# Patient Record
Sex: Female | Born: 1960 | Race: White | Hispanic: No | Marital: Married | Smoking: Current every day smoker
Health system: Southern US, Community
[De-identification: ages and names within clinical notes are randomized; demographics above are authoritative.]

## PROBLEM LIST (undated history)

## (undated) DIAGNOSIS — J439 Emphysema, unspecified: Secondary | ICD-10-CM

## (undated) DIAGNOSIS — J449 Chronic obstructive pulmonary disease, unspecified: Secondary | ICD-10-CM

## (undated) DIAGNOSIS — F1910 Other psychoactive substance abuse, uncomplicated: Secondary | ICD-10-CM

## (undated) HISTORY — PX: NO PAST SURGERIES: SHX2092

---

## 2006-11-24 ENCOUNTER — Emergency Department: Payer: Self-pay | Admitting: Emergency Medicine

## 2011-02-07 ENCOUNTER — Emergency Department: Payer: Self-pay | Admitting: Emergency Medicine

## 2012-09-28 ENCOUNTER — Ambulatory Visit: Payer: Self-pay | Admitting: Internal Medicine

## 2012-09-28 LAB — URINALYSIS, COMPLETE
Bacteria: NEGATIVE
Blood: NEGATIVE
Glucose,UR: NEGATIVE mg/dL (ref 0–75)
Leukocyte Esterase: NEGATIVE
Nitrite: NEGATIVE
Ph: 6 (ref 4.5–8.0)
Protein: 100
Specific Gravity: >=1.03 (ref 1.003–1.030)
WBC UR: NONE SEEN /HPF (ref 0–5)

## 2012-09-30 LAB — URINE CULTURE

## 2014-03-12 ENCOUNTER — Ambulatory Visit: Payer: Self-pay | Admitting: Family Medicine

## 2014-03-12 LAB — URINALYSIS, COMPLETE
BACTERIA: NEGATIVE
Bilirubin,UR: NEGATIVE
Blood: NEGATIVE
Glucose,UR: NEGATIVE
KETONE: NEGATIVE
LEUKOCYTE ESTERASE: NEGATIVE
Nitrite: NEGATIVE
PROTEIN: NEGATIVE
Ph: 6 (ref 5.0–8.0)
SQUAMOUS EPITHELIAL: NONE SEEN
Specific Gravity: 1.015 (ref 1.000–1.030)
WBC UR: NONE SEEN /HPF (ref 0–5)

## 2014-03-14 LAB — URINE CULTURE

## 2016-11-24 ENCOUNTER — Encounter: Payer: Self-pay | Admitting: *Deleted

## 2016-11-24 ENCOUNTER — Ambulatory Visit
Admission: EM | Admit: 2016-11-24 | Discharge: 2016-11-24 | Disposition: A | Discharge status: Home / Self Care | Payer: Medicaid Other | Attending: Family Medicine | Admitting: Family Medicine

## 2016-11-24 DIAGNOSIS — F17200 Nicotine dependence, unspecified, uncomplicated: Secondary | ICD-10-CM | POA: Diagnosis not present

## 2016-11-24 DIAGNOSIS — S1096XA Insect bite of unspecified part of neck, initial encounter: Secondary | ICD-10-CM | POA: Insufficient documentation

## 2016-11-24 DIAGNOSIS — W57XXXA Bitten or stung by nonvenomous insect and other nonvenomous arthropods, initial encounter: Secondary | ICD-10-CM

## 2016-11-24 MED ORDER — MUPIROCIN 2 % EX OINT: TOPICAL_OINTMENT | CUTANEOUS | 0 refills | Status: DC

## 2016-11-24 MED ORDER — DOXYCYCLINE HYCLATE 100 MG PO CAPS: 100.0000 mg | ORAL_CAPSULE | Freq: Two times a day (BID) | ORAL | 0 refills | Status: DC

## 2016-11-24 NOTE — ED Provider Notes (Signed)
[facility] ____________________________________________  Time seen: Approximately 11:55 AM  I have reviewed the triage vital signs and the nursing notes.   HISTORY  Chief Complaint Insect Bite  HPI Erica Schmidt is a 56 y.o. female  presenting for evaluation of tick bite site to right neck. Patient states she removed a tick to the area 2-3 days ago and has since had itching and tenderness. Patient expressed concern of swelling directly around tick bite site. Denies fevers. Reports continues to eat and drink well. Denies any pain or difficulty swallowing. Denies any lip, tongue, oral or throat swelling sensation. Denies any other skin rash, skin changes or insect bites. Patient states that she thinks the tick was attached 1-2 days, but unsure. Patient states that she feels that she probably picked up to take when she went and walked to the chicken coup. Reports the area does itch but has some tenderness. Denies any sore throat, ear pain, facial swelling, vision changes, joint pain, fevers, vomiting, dizziness or cough.  Denies chest pain, shortness of breath, abdominal pain.  Denies recent sickness. Denies recent antibiotic use. Reports tetanus immunization is up-to-date. Patient denies chronic medical problems. States does not take daily medications. denies cardiac history or renal insufficiency.    History reviewed. No pertinent past medical history. Denies  Patient denies past medical history, in reviewing care everywhere in [place], multiple medical history notes present including history of borderline intellectual functioning, benzodiazepine abuse, methadone use and abuse as well as polysubstance abuse, hypertension, aspiration pneumonia.  There are no active problems to display for this patient.   History reviewed. No pertinent surgical history.   No current facility-administered medications for this encounter.   Current Outpatient Prescriptions:  .  doxycycline  (VIBRAMYCIN) 100 MG capsule, Take 1 capsule (100 mg total) by mouth 2 (two) times daily., Disp: 20 capsule, Rfl: 0 .  mupirocin ointment (BACTROBAN) 2 %, Apply three times a day for 7 days., Disp: 22 g, Rfl: 0  Allergies Patient has no known allergies.  History reviewed. No pertinent family history.  Social History Social History  Substance Use Topics  . Smoking status: Current Every Day Smoker  . Smokeless tobacco: Never Used  . Alcohol use No  Patient denies being an abusive relationship. Patient denies SI or HI.  Review of Systems Constitutional: No fever/chills Eyes: No visual changes. ENT: No sore throat. Cardiovascular: Denies chest pain. Respiratory: Denies shortness of breath. Gastrointestinal: No abdominal pain.  No nausea, no vomiting.  No diarrhea.  No constipation. Genitourinary: Negative for dysuria. Musculoskeletal: Negative for back pain. Skin: as above. Neurological: Negative for headaches, focal weakness or numbness.  ____________________________________________   PHYSICAL EXAM:  VITAL SIGNS: ED Triage Vitals  Enc Vitals Group     BP 11/24/16 1113 (!) 143/76     Pulse Rate 11/24/16 1113 76     Resp 11/24/16 1113 16     Temp 11/24/16 1113 98.4 F (36.9 C)     Temp Source 11/24/16 1113 Oral     SpO2 11/24/16 1113 92 %     Weight 11/24/16 1115 100 lb (45.4 kg)     Height 11/24/16 1115 5\' 2"  (1.575 m)     Head Circumference --      Peak Flow --      Pain Score --      Pain Loc --      Pain Edu? --      Excl. in GC? --     Constitutional: Alert  and oriented. Well appearing. Patient appears anxious. Eyes: Conjunctivae are normal.  ENT      Head: Normocephalic and atraumatic.      Ears: no erythema, normal TMs bilaterally.       Nose: No congestion/rhinnorhea.      Mouth/Throat: Mucous membranes are moist.Oropharynx non-erythematous. No tonsillar swelling or exudate. No lip, tongue or oral swelling noted. Neck: No stridor. Supple without  meningismus.  Hematological/Lymphatic/Immunilogical: No cervical lymphadenopathy. Cardiovascular: Normal rate, regular rhythm. Grossly normal heart sounds.  Good peripheral circulation. Respiratory: Normal respiratory effort without tachypnea nor retractions. Breath sounds are clear and equal bilaterally. No wheezes, rales, rhonchi. Gastrointestinal: Soft and nontender.  Musculoskeletal: Ambulatory with steady gait. No midline cervical, thoracic or lumbar tenderness to palpation.  Neurologic:  Normal speech and language. No gross focal neurologic deficits are appreciated. Speech is normal. No gait instability.  Skin:  Skin is warm, dry . Except: Right lower anterior neck 2 areas of approximately 1 cm minimally erythematous and minimally raised lesion with centered punctum, no drainage, no fluctuance or induration, no appearance of the foreign body, no further surrounding erythema, nontender to direct palpation, no appearance of bull's-eye rash, no other swelling noted, no palpated lymphadenopathy.  Psychiatric: Mood and affect are normal. Speech and behavior are normal. Patient exhibits appropriate insight and judgment   ___________________________________________   LABS (all labs ordered are listed, but only abnormal results are displayed)  Labs Reviewed  ROCKY MTN SPOTTED FVR ABS PNL(IGG+IGM)  B. BURGDORFI ANTIBODIES    RADIOLOGY  No results found. ____________________________________________   PROCEDURES Procedures    INITIAL IMPRESSION / ASSESSMENT AND PLAN / ED COURSE  Pertinent labs & imaging results that were available during my care of the patient were reviewed by me and considered in my medical decision making (see chart for details).  Patient appears anxious. Overall well-appearing. Drinking fluids in urgent care. Resent for evaluation of the tick bite site to right anterior neck. 2 areas noted and suspicious for recent insect bite. Discussed with patient areas appear to  be consistent with local reaction from insect bite, patient expresses concern of "tick fever "and patient seems to be unable to give complete clear history of timing, discussed with patient we'll [facility] spotted fever labs as well as Lyme disease labs. Will empirically start patient on oral doxycycline and topical Bactroban. Discussed over-the-counter Tylenol or ibuprofen use as needed. Encourage avoidance of scratching and rubbing the area and monitoring areas closely. Discussed strict follow-up and return parameters with patient. Discussed indication, risks and benefits of medications with patient.  Discussed follow up with Primary care physician this week. Discussed follow up and return parameters including no resolution or any worsening concerns. Patient verbalized understanding and agreed to plan.   ____________________________________________   FINAL CLINICAL IMPRESSION(S) / ED DIAGNOSES  Final diagnoses:  Tick bite, initial encounter     Discharge Medication List as of 11/24/2016 11:40 AM    START taking these medications   Details  doxycycline (VIBRAMYCIN) 100 MG capsule Take 1 capsule (100 mg total) by mouth 2 (two) times daily., Starting Wed 11/24/2016, Normal    mupirocin ointment (BACTROBAN) 2 % Apply three times a day for 7 days., Normal        Note: This dictation was prepared with Dragon dictation along with smaller phrase technology. Any transcriptional errors that result from this process are unintentional.         Renford DillsMiller, Shada Nienaber, NP 11/24/16 1215

## 2016-11-24 NOTE — ED Triage Notes (Signed)
Pt removed a tick form her right anterior neck 4 days. Now c/o pain and edema at site.

## 2016-11-24 NOTE — Discharge Instructions (Signed)
Take medication as prescribed. Rest. Drink plenty of fluids.  ° °Follow up with your primary care physician this week. Return to Urgent care for new or worsening concerns.  ° °

## 2016-11-25 LAB — B. BURGDORFI ANTIBODIES

## 2016-11-25 LAB — ROCKY MTN SPOTTED FVR ABS PNL(IGG+IGM)
RMSF IGM: 0.88 {index} (ref 0.00–0.89)
RMSF IgG: NEGATIVE

## 2017-10-27 ENCOUNTER — Other Ambulatory Visit: Payer: Self-pay

## 2017-10-27 ENCOUNTER — Encounter: Payer: Self-pay | Admitting: Emergency Medicine

## 2017-10-27 ENCOUNTER — Ambulatory Visit
Admission: EM | Admit: 2017-10-27 | Discharge: 2017-10-27 | Disposition: A | Discharge status: Home / Self Care | Payer: Medicaid Other | Attending: Emergency Medicine | Admitting: Emergency Medicine

## 2017-10-27 DIAGNOSIS — Z760 Encounter for issue of repeat prescription: Secondary | ICD-10-CM | POA: Insufficient documentation

## 2017-10-27 DIAGNOSIS — Z825 Family history of asthma and other chronic lower respiratory diseases: Secondary | ICD-10-CM | POA: Diagnosis not present

## 2017-10-27 DIAGNOSIS — F17210 Nicotine dependence, cigarettes, uncomplicated: Secondary | ICD-10-CM | POA: Insufficient documentation

## 2017-10-27 DIAGNOSIS — J449 Chronic obstructive pulmonary disease, unspecified: Secondary | ICD-10-CM

## 2017-10-27 DIAGNOSIS — K1230 Oral mucositis (ulcerative), unspecified: Secondary | ICD-10-CM | POA: Insufficient documentation

## 2017-10-27 DIAGNOSIS — J439 Emphysema, unspecified: Secondary | ICD-10-CM | POA: Diagnosis not present

## 2017-10-27 DIAGNOSIS — Z820 Family history of epilepsy and other diseases of the nervous system: Secondary | ICD-10-CM | POA: Diagnosis not present

## 2017-10-27 DIAGNOSIS — R070 Pain in throat: Secondary | ICD-10-CM | POA: Diagnosis present

## 2017-10-27 HISTORY — DX: Chronic obstructive pulmonary disease, unspecified: J44.9

## 2017-10-27 HISTORY — DX: Emphysema, unspecified: J43.9

## 2017-10-27 LAB — RAPID STREP SCREEN (MED CTR MEBANE ONLY): Streptococcus, Group A Screen (Direct): NEGATIVE

## 2017-10-27 MED ORDER — ACETAMINOPHEN 500 MG PO TABS
1000.0000 mg | ORAL_TABLET | Freq: Once | ORAL | Status: AC
  Administered 2017-10-27: 1000 mg via ORAL

## 2017-10-27 MED ORDER — FIRST-DUKES MOUTHWASH MT SUSP: 10.0000 mL | Freq: Four times a day (QID) | OROMUCOSAL | 0 refills | Status: DC | PRN

## 2017-10-27 MED ORDER — AEROCHAMBER PLUS MISC: 2 refills | Status: DC

## 2017-10-27 MED ORDER — ALBUTEROL SULFATE HFA 108 (90 BASE) MCG/ACT IN AERS: 1.0000 | INHALATION_SPRAY | Freq: Four times a day (QID) | RESPIRATORY_TRACT | 0 refills | Status: DC | PRN

## 2017-10-27 MED ORDER — LIDOCAINE VISCOUS 2 % MT SOLN: 10.0000 mL | Freq: Four times a day (QID) | OROMUCOSAL | 0 refills | Status: DC | PRN

## 2017-10-27 NOTE — ED Triage Notes (Signed)
Patient in today c/o sore throat and "mouth burning" x 3 days.

## 2017-10-27 NOTE — ED Provider Notes (Signed)
HPI  SUBJECTIVE:  Erica Schmidt is a 57 y.o. female who presents with 2 days of constant burning pain in her mouth, throat, and along her inner lips. She states that her lips feel swollen and she reports swollen neck glands.  No itching.  No fevers, no rash around her mouth or rash elsewhere.  No white spots on tongue or cheeks.  She has not been on any medications recently.  No recent antibiotics.  She has not had any oral steroids or steroid inhalers recently she actually has no prescriptions for her COPD.  She does not wear dentures.  She has never had symptoms like this before.  She states that she drank some store bought liquor and her symptoms started about 2 days afterwards.  No other new foods or beverages.  She denies vaginal itching or discharge.  She tried peroxide, rubbing alcohol with worsening of her symptoms.  Symptoms are also worse with swallowing.  Symptoms are better with Carmex.  She has a past medical history of COPD/emphysema, continues to smoke.  States that she is in the process of quitting.  She has a history of polysubstance abuse.  No history of hypertension, diabetes, HIV.  PMD: None.  pt's phone number [phone]    Past Medical History:  Diagnosis Date  . COPD (chronic obstructive pulmonary disease) (HCC)   . Emphysema of lung (HCC)     History reviewed. No pertinent surgical history.  Family History  Problem Relation Age of Onset  . Epilepsy Mother   . Emphysema Father   . Alcohol abuse Father     Social History   Tobacco Use  . Smoking status: Current Every Day Smoker    Packs/day: 0.50    Types: Cigarettes  . Smokeless tobacco: Never Used  Substance Use Topics  . Alcohol use: No  . Drug use: No    No current facility-administered medications for this encounter.   Current Outpatient Medications:  .  albuterol (PROVENTIL HFA;VENTOLIN HFA) 108 (90 Base) MCG/ACT inhaler, Inhale 1-2 puffs into the lungs every 6 (six) hours as needed for  wheezing or shortness of breath., Disp: 1 Inhaler, Rfl: 0 .  Diphenhyd-Hydrocort-Nystatin (FIRST-DUKES MOUTHWASH) SUSP, Use as directed 10 mLs in the mouth or throat 4 (four) times daily as needed., Disp: 237 mL, Rfl: 0 .  lidocaine (XYLOCAINE) 2 % solution, Use as directed 10 mLs in the mouth or throat every 6 (six) hours as needed. Swish and spit. Do not swallow., Disp: 100 mL, Rfl: 0 .  Spacer/Aero-Holding Chambers (AEROCHAMBER PLUS) inhaler, Use as instructed, Disp: 1 each, Rfl: 2  No Known Allergies   ROS  As noted in HPI.   Physical Exam  BP (!) 150/91 (BP Location: Left Arm)   Pulse (!) 109   Temp 98.1 F (36.7 C) (Oral)   Resp 20   Ht 5\' 2"  (1.575 m)   Wt 110 lb (49.9 kg)   SpO2 93%   BMI 20.12 kg/m   Constitutional: Well developed, well nourished, no acute distress Eyes:  EOMI, conjunctiva normal bilaterally HENT: Normocephalic, atraumatic,mucus membranes moist.  Positive aphthous ulcers on upper inner lip, positive ulcerated hard palate, uvula.  Tonsils normal. Neck: No cervical lymphadenopathy Respiratory: Normal inspiratory effort Cardiovascular: Normal rate GI: nondistended skin: No rash, skin intact Musculoskeletal: no deformities Neurologic: Alert & oriented x 3, no focal neuro deficits Psychiatric: Speech and behavior appropriate   ED Course   Medications  acetaminophen (TYLENOL) tablet 1,000 mg (1,000 mg  Oral Given 10/27/17 1603)    Orders Placed This Encounter  Procedures  . Rapid Strep Screen ([facility] ONLY)    Standing Status:   Standing    Number of Occurrences:   1  . Culture, group A strep    Standing Status:   Standing    Number of Occurrences:   1    Results for orders placed or performed during the hospital encounter of 10/27/17 (from the past 24 hour(s))  Rapid Strep Screen ([facility] ONLY)     Status: None   Collection Time: 10/27/17  3:57 PM  Result Value Ref Range   Streptococcus, Group A Screen (Direct) NEGATIVE NEGATIVE   No  results found.  ED Clinical Impression  Mucositis oral  Medication refill  Chronic obstructive pulmonary disease, unspecified COPD type (HCC)   ED Assessment/Plan  O2 sat noted.  This is around her baseline.  Patient with mucositis/stomatitis.  Does not appear to be Stevens-Johnson syndrome.  Will send home with salt water gargles, Dukes first mouthwash with nystatin, triamcinolone, Benadryl, viscous lidocaine, advised Tylenol and ibuprofen as needed for pain.  She also has some wheezing so we will refill her albuterol inhaler.  She is not sure what other inhalers that she is on, so she declined prescriptions for a long-acting bronchodilator/inhaled steroid.  Will provide a primary care referral list for ongoing care and ordered to primary care referral. Discussed MDM, treatment plan, and plan for follow-up with patient. patient agrees with plan.   Meds ordered this encounter  Medications  . acetaminophen (TYLENOL) tablet 1,000 mg  . DISCONTD: Diphenhyd-Hydrocort-Nystatin (FIRST-DUKES MOUTHWASH) SUSP    Sig: Use as directed 10 mLs in the mouth or throat 4 (four) times daily as needed.    Dispense:  237 mL    Refill:  0  . Spacer/Aero-Holding Chambers (AEROCHAMBER PLUS) inhaler    Sig: Use as instructed    Dispense:  1 each    Refill:  2  . albuterol (PROVENTIL HFA;VENTOLIN HFA) 108 (90 Base) MCG/ACT inhaler    Sig: Inhale 1-2 puffs into the lungs every 6 (six) hours as needed for wheezing or shortness of breath.    Dispense:  1 Inhaler    Refill:  0  . DISCONTD: lidocaine (XYLOCAINE) 2 % solution    Sig: Use as directed 10 mLs in the mouth or throat every 6 (six) hours as needed. Swish and spit. Do not swallow.    Dispense:  100 mL    Refill:  0  . Diphenhyd-Hydrocort-Nystatin (FIRST-DUKES MOUTHWASH) SUSP    Sig: Use as directed 10 mLs in the mouth or throat 4 (four) times daily as needed.    Dispense:  237 mL    Refill:  0  . lidocaine (XYLOCAINE) 2 % solution    Sig: Use  as directed 10 mLs in the mouth or throat every 6 (six) hours as needed. Swish and spit. Do not swallow.    Dispense:  100 mL    Refill:  0    *This clinic note was created using Scientist, clinical (histocompatibility and immunogenetics)Dragon dictation software. Therefore, there may be occasional mistakes despite careful proofreading.   ?    Domenick GongMortenson, Icis Budreau, MD 10/27/17 [phone]

## 2017-10-27 NOTE — Discharge Instructions (Addendum)
2 puffs from your albuterol inhaler every 4-6 hours as needed for coughing, wheezing, shortness of breath.  Follow-up with a primary care provider of your choice as soon as possible.  You may need additional inhalers when you start feeling better.  Take the first Dukes mouthwash as directed and use the lidocaine every 6 hours as needed.  Do not swallow the lidocaine.  Also may take 500 mg of Tylenol and 400 mg of ibuprofen together as needed for pain 3 or 4 times a day.  Go immediately to the emergency room for fevers above 100.4, if your pain is not controlled with these medicines, if you break out in a rash elsewhere, he cannot eat or drink because of the pain, or for other concerns.  Here is a list of primary care providers who are taking new patients:  Dr. Elizabeth Sauereanna Jones, Dr. Schuyler AmorWilliam Plonk [address] [place] [address] [phone]  [facility] [place] [address]  [place] [address]  [phone]  [facility] [address]  [place], [place] [address] [phone]  [facility] [place] [address]  [phone] [place], [place] [address]  Here are clinics/ other resources who will see you if you do not have insurance. Some have certain criteria that you must meet. Call them and find out what they are:  [facility]: [address] [place] [address] [place], [place] [address] Phone: [phone] Hours: First and Third Saturdays of each Month, 9 a.m. - 1 p.m.  [facility]: [address], [place], [place] [address] Phone: [phone] Hours: Tuesday, 4 p.m. - 8 p.m. Thursday, 1 p.m. - 8 p.m. Wednesday, 9 a.m. - [facility]  [facility] [address], [place], [place] [address] Phone: [phone] Pharmacy Phone Number: [phone] Dental Phone Number: [phone] [facility] Insurance Help: [phone]  Dental Hours: Monday - Thursday, 8 a.m. - 6 p.m.  [facility] [address]  [place], [place] [address] Phone: [phone] Pharmacy Phone Number: [phone] [facility] Insurance Help: [phone]  [facility] [address] [place], [place] [address] Phone: [phone] Pharmacy Phone Number: [phone] [facility] Insurance Help: [phone]  [facility] [address] [place], [place] [address] Phone: [phone] [facility] Insurance Help: [phone]   [facility] [address] [place], [place] [address] Phone: [phone]  Go to www.goodrx.com to look up your medications. This will give you a list of where you can find your prescriptions at the most affordable prices. Or ask the pharmacist what the cash price is, or if they have any other discount programs available to help make your medication more affordable. This can be less expensive than what you would pay with insurance.

## 2017-10-30 LAB — CULTURE, GROUP A STREP (THRC)

## 2017-10-31 ENCOUNTER — Telehealth (HOSPITAL_COMMUNITY): Payer: Self-pay

## 2017-11-02 ENCOUNTER — Telehealth (HOSPITAL_COMMUNITY): Payer: Self-pay

## 2017-11-02 NOTE — Telephone Encounter (Signed)
Pt called regarding Culture is positive for non group A Strep germ.  This is a finding of uncertain significance; not the typical 'strep throat' germ.  No answer at this time. Voicemail left.

## 2018-01-09 ENCOUNTER — Other Ambulatory Visit: Payer: Self-pay

## 2018-01-09 ENCOUNTER — Emergency Department: Payer: Medicaid Other

## 2018-01-09 ENCOUNTER — Inpatient Hospital Stay
Admission: EM | Admit: 2018-01-09 | Discharge: 2018-01-16 | DRG: 981 | Disposition: A | Discharge status: Home / Self Care | Payer: Medicaid Other | Attending: Specialist | Admitting: Specialist

## 2018-01-09 DIAGNOSIS — F17210 Nicotine dependence, cigarettes, uncomplicated: Secondary | ICD-10-CM | POA: Diagnosis present

## 2018-01-09 DIAGNOSIS — Z9911 Dependence on respirator [ventilator] status: Secondary | ICD-10-CM

## 2018-01-09 DIAGNOSIS — J690 Pneumonitis due to inhalation of food and vomit: Secondary | ICD-10-CM | POA: Diagnosis not present

## 2018-01-09 DIAGNOSIS — A419 Sepsis, unspecified organism: Secondary | ICD-10-CM | POA: Diagnosis not present

## 2018-01-09 DIAGNOSIS — F32A Depression, unspecified: Secondary | ICD-10-CM

## 2018-01-09 DIAGNOSIS — F10129 Alcohol abuse with intoxication, unspecified: Secondary | ICD-10-CM | POA: Diagnosis present

## 2018-01-09 DIAGNOSIS — F329 Major depressive disorder, single episode, unspecified: Secondary | ICD-10-CM | POA: Diagnosis present

## 2018-01-09 DIAGNOSIS — I10 Essential (primary) hypertension: Secondary | ICD-10-CM | POA: Diagnosis present

## 2018-01-09 DIAGNOSIS — Y904 Blood alcohol level of 80-99 mg/100 ml: Secondary | ICD-10-CM | POA: Diagnosis present

## 2018-01-09 DIAGNOSIS — E876 Hypokalemia: Secondary | ICD-10-CM | POA: Diagnosis present

## 2018-01-09 DIAGNOSIS — J9601 Acute respiratory failure with hypoxia: Secondary | ICD-10-CM | POA: Diagnosis not present

## 2018-01-09 DIAGNOSIS — E872 Acidosis: Secondary | ICD-10-CM | POA: Diagnosis present

## 2018-01-09 DIAGNOSIS — J9602 Acute respiratory failure with hypercapnia: Secondary | ICD-10-CM | POA: Diagnosis not present

## 2018-01-09 DIAGNOSIS — E43 Unspecified severe protein-calorie malnutrition: Secondary | ICD-10-CM

## 2018-01-09 DIAGNOSIS — J449 Chronic obstructive pulmonary disease, unspecified: Secondary | ICD-10-CM | POA: Diagnosis present

## 2018-01-09 DIAGNOSIS — R4182 Altered mental status, unspecified: Secondary | ICD-10-CM

## 2018-01-09 DIAGNOSIS — J439 Emphysema, unspecified: Secondary | ICD-10-CM | POA: Diagnosis present

## 2018-01-09 DIAGNOSIS — T50904A Poisoning by unspecified drugs, medicaments and biological substances, undetermined, initial encounter: Secondary | ICD-10-CM | POA: Diagnosis present

## 2018-01-09 DIAGNOSIS — F1310 Sedative, hypnotic or anxiolytic abuse, uncomplicated: Secondary | ICD-10-CM | POA: Diagnosis present

## 2018-01-09 DIAGNOSIS — T424X1A Poisoning by benzodiazepines, accidental (unintentional), initial encounter: Secondary | ICD-10-CM | POA: Diagnosis present

## 2018-01-09 DIAGNOSIS — R6521 Severe sepsis with septic shock: Secondary | ICD-10-CM | POA: Diagnosis not present

## 2018-01-09 DIAGNOSIS — N170 Acute kidney failure with tubular necrosis: Secondary | ICD-10-CM | POA: Diagnosis not present

## 2018-01-09 DIAGNOSIS — F1010 Alcohol abuse, uncomplicated: Secondary | ICD-10-CM

## 2018-01-09 DIAGNOSIS — Z4659 Encounter for fitting and adjustment of other gastrointestinal appliance and device: Secondary | ICD-10-CM

## 2018-01-09 DIAGNOSIS — G92 Toxic encephalopathy: Secondary | ICD-10-CM | POA: Diagnosis present

## 2018-01-09 HISTORY — DX: Other psychoactive substance abuse, uncomplicated: F19.10

## 2018-01-09 LAB — CBC WITH DIFFERENTIAL/PLATELET
Basophils Absolute: 0.1 10*3/uL (ref 0–0.1)
Basophils Relative: 1 %
EOS ABS: 0 10*3/uL (ref 0–0.7)
Eosinophils Relative: 0 %
HEMATOCRIT: 44.4 % (ref 35.0–47.0)
HEMOGLOBIN: 14.7 g/dL (ref 12.0–16.0)
LYMPHS ABS: 1.5 10*3/uL (ref 1.0–3.6)
LYMPHS PCT: 17 %
MCH: 31.4 pg (ref 26.0–34.0)
MCHC: 33.2 g/dL (ref 32.0–36.0)
MCV: 94.6 fL (ref 80.0–100.0)
MONOS PCT: 7 %
Monocytes Absolute: 0.6 10*3/uL (ref 0.2–0.9)
NEUTROS PCT: 75 %
Neutro Abs: 6.5 10*3/uL (ref 1.4–6.5)
Platelets: 273 10*3/uL (ref 150–440)
RBC: 4.69 MIL/uL (ref 3.80–5.20)
RDW: 15.8 % — ABNORMAL HIGH (ref 11.5–14.5)
WBC: 8.7 10*3/uL (ref 3.6–11.0)

## 2018-01-09 LAB — COMPREHENSIVE METABOLIC PANEL
ALT: 17 U/L (ref 0–44)
AST: 28 U/L (ref 15–41)
Albumin: 3.8 g/dL (ref 3.5–5.0)
Alkaline Phosphatase: 51 U/L (ref 38–126)
Anion gap: 11 (ref 5–15)
BILIRUBIN TOTAL: 0.2 mg/dL — AB (ref 0.3–1.2)
BUN: 17 mg/dL (ref 6–20)
CHLORIDE: 104 mmol/L (ref 98–111)
CO2: 29 mmol/L (ref 22–32)
CREATININE: 0.73 mg/dL (ref 0.44–1.00)
Calcium: 8.6 mg/dL — ABNORMAL LOW (ref 8.9–10.3)
GFR calc Af Amer: >60 mL/min (ref >60)
Glucose, Bld: 171 mg/dL — ABNORMAL HIGH (ref 70–99)
Potassium: 3.7 mmol/L (ref 3.5–5.1)
Sodium: 144 mmol/L (ref 135–145)
Total Protein: 8.3 g/dL — ABNORMAL HIGH (ref 6.5–8.1)

## 2018-01-09 LAB — SALICYLATE LEVEL: SALICYLATE LVL: 19.9 mg/dL (ref 2.8–30.0)

## 2018-01-09 LAB — ACETAMINOPHEN LEVEL: Acetaminophen (Tylenol), Serum: <10 ug/mL — ABNORMAL LOW (ref 10–30)

## 2018-01-09 LAB — ETHANOL: Alcohol, Ethyl (B): 60 mg/dL — ABNORMAL HIGH (ref <10)

## 2018-01-09 MED ORDER — SODIUM CHLORIDE 0.9 % IV BOLUS
500.0000 mL | Freq: Once | INTRAVENOUS | Status: AC
  Administered 2018-01-09: 500 mL via INTRAVENOUS

## 2018-01-09 NOTE — ED Notes (Signed)
Pt daughter Thereasa Distance(Amber Nicol) is point of contact, [phone].

## 2018-01-09 NOTE — ED Triage Notes (Signed)
[facility]. Reportedly took three 2mg  Klonpin (given to her by a friend) and possible ETOH. Hx of use of crack in the past. Lethargic and only arouses to pain. Oriented to self only. Pt is sonorus respirations when not stimulated. EMS gave 4mg  Narcan with no improvement.

## 2018-01-09 NOTE — ED Notes (Signed)
Tech remains at bedside with pt. Pt to CT at this time.

## 2018-01-09 NOTE — H&P (Signed)
[facility] [place] [facility]   PATIENT NAME: Erica Schmidt    MR#:  [id number]  DATE OF BIRTH:  September 08, 1960  DATE OF ADMISSION:  01/09/2018  PRIMARY CARE PHYSICIAN: [facility]   REQUESTING/REFERRING PHYSICIAN: Fanny BienQuale, MD  CHIEF COMPLAINT:   Chief Complaint  Patient presents with  . Drug Overdose    HISTORY OF PRESENT ILLNESS:  Erica IdaDeborah Branaman  is a 57 y.o. female who presents with unresponsive episode.  Patient has a history of polysubstance abuse, and there is strong concern for potential overdose.  Patient's alcohol level is elevated, salicylate level is also elevated, and she is U tox positive for benzos.  Hospitalist were called for admission  PAST MEDICAL HISTORY:   Past Medical History:  Diagnosis Date  . COPD (chronic obstructive pulmonary disease) (HCC)   . Emphysema of lung (HCC)   . Polysubstance abuse (HCC)      PAST SURGICAL HISTORY:   Past Surgical History:  Procedure Laterality Date  . NO PAST SURGERIES       SOCIAL HISTORY:   Social History   Tobacco Use  . Smoking status: Current Every Day Smoker    Packs/day: 0.50    Types: Cigarettes  . Smokeless tobacco: Never Used  Substance Use Topics  . Alcohol use: No     FAMILY HISTORY:   Family History  Problem Relation Age of Onset  . Epilepsy Mother   . Emphysema Father   . Alcohol abuse Father      DRUG ALLERGIES:  No Known Allergies  MEDICATIONS AT HOME:   Prior to Admission medications   Medication Sig Start Date End Date Taking? Authorizing Provider  albuterol (PROVENTIL HFA;VENTOLIN HFA) 108 (90 Base) MCG/ACT inhaler Inhale 1-2 puffs into the lungs every 6 (six) hours as needed for wheezing or shortness of breath. 10/27/17   Domenick GongMortenson, Ashley, MD  Diphenhyd-Hydrocort-Nystatin (FIRST-DUKES MOUTHWASH) SUSP Use as directed 10 mLs in the mouth or throat 4 (four) times daily as needed. 10/27/17   Domenick GongMortenson, Ashley, MD  lidocaine  (XYLOCAINE) 2 % solution Use as directed 10 mLs in the mouth or throat every 6 (six) hours as needed. Swish and spit. Do not swallow. 10/27/17   Domenick GongMortenson, Ashley, MD  Spacer/Aero-Holding Chambers (AEROCHAMBER PLUS) inhaler Use as instructed 10/27/17   Domenick GongMortenson, Ashley, MD    REVIEW OF SYSTEMS:  Review of Systems  Unable to perform ROS: Acuity of condition     VITAL SIGNS:   Vitals:   01/09/18 2200 01/09/18 2203 01/09/18 2300  BP: (!) 141/91  123/87  Pulse: 94  84  Resp: 12  17  Temp: (!) 96.1 F (35.6 C)    TempSrc: Oral    SpO2: (!) 88%  100%  Weight:  49.9 kg (110 lb)   Height:  5' (1.524 m)    Wt Readings from Last 3 Encounters:  01/09/18 49.9 kg (110 lb)  10/27/17 49.9 kg (110 lb)  11/24/16 45.4 kg (100 lb)    PHYSICAL EXAMINATION:  Physical Exam  Vitals reviewed. Constitutional: She appears well-developed and well-nourished. No distress.  HENT:  Head: Normocephalic and atraumatic.  Mouth/Throat: Oropharynx is clear and moist.  Eyes: Pupils are equal, round, and reactive to light. Conjunctivae and EOM are normal. No scleral icterus.  Neck: Normal range of motion. Neck supple. No JVD present. No thyromegaly present.  Cardiovascular: Normal rate, regular rhythm and intact distal pulses. Exam reveals no gallop and no friction rub.  No  murmur heard. Respiratory: Effort normal and breath sounds normal. No respiratory distress. She has no wheezes. She has no rales.  GI: Soft. Bowel sounds are normal. She exhibits no distension. There is no tenderness.  Musculoskeletal: Normal range of motion. She exhibits no edema.  No arthritis, no gout  Lymphadenopathy:    She has no cervical adenopathy.  Neurological:  Unable to assess due to patient condition  Skin: Skin is warm and dry. No rash noted. No erythema.  Psychiatric:  Unable to assess due to patient condition    LABORATORY PANEL:   CBC Recent Labs  Lab 01/09/18 2212  WBC 8.7  HGB 14.7  HCT 44.4  PLT 273    ------------------------------------------------------------------------------------------------------------------  Chemistries  Recent Labs  Lab 01/09/18 2212  NA 144  K 3.7  CL 104  CO2 29  GLUCOSE 171*  BUN 17  CREATININE 0.73  CALCIUM 8.6*  AST 28  ALT 17  ALKPHOS 51  BILITOT 0.2*   ------------------------------------------------------------------------------------------------------------------  Cardiac Enzymes No results for input(s): TROPONINI in the last 168 hours. ------------------------------------------------------------------------------------------------------------------  RADIOLOGY:  Ct Head Wo Contrast  Result Date: 01/09/2018 CLINICAL DATA:  57 year old female with altered mental status. EXAM: CT HEAD WITHOUT CONTRAST TECHNIQUE: Contiguous axial images were obtained from the base of the skull through the vertex without intravenous contrast. COMPARISON:  None. FINDINGS: Brain: The ventricles and sulci appropriate size for patient's age. Mild periventricular and deep white matter chronic microvascular ischemic changes noted. There is no acute intracranial hemorrhage. No mass effect or midline shift. No extra-axial fluid collection. Vascular: No hyperdense vessel or unexpected calcification. Skull: Normal. Negative for fracture or focal lesion. Sinuses/Orbits: No acute finding. Other: None IMPRESSION: 1. No acute intracranial pathology. 2. Mild chronic microvascular ischemic changes. Electronically Signed   By: Elgie Collard M.D.   On: 01/09/2018 22:31   Dg Chest Port 1 View  Result Date: 01/09/2018 CLINICAL DATA:  Hypoxia with altered mental status EXAM: PORTABLE CHEST 1 VIEW COMPARISON:  None. FINDINGS: Hyperinflation with emphysematous disease. No acute consolidation or effusion. Cardiomediastinal silhouette within normal limits. Aortic atherosclerosis. No pneumothorax. Old appearing right sixth and seventh rib deformities. IMPRESSION: No active disease.   Hyperinflation with emphysematous disease. Electronically Signed   By: Jasmine Pang M.D.   On: 01/09/2018 22:19    EKG:   Orders placed or performed during the hospital encounter of 01/09/18  . EKG 12-Lead  . EKG 12-Lead  . ED EKG  . ED EKG  . EKG 12-Lead  . EKG 12-Lead    IMPRESSION AND PLAN:  Principal Problem:   Overdose, undetermined intent, initial encounter -patient was IVC by ED physician, psychiatry consult in place, currently stable in terms of vital signs.  Monitor closely, repeat salicylate level Active Problems:   COPD (chronic obstructive pulmonary disease) (HCC) -continue home inhaler   HTN (hypertension) -continue home meds  Chart review performed and case discussed with ED provider. Labs, imaging and/or ECG reviewed by provider and discussed with patient/family. Management plans discussed with the patient and/or family.  DVT PROPHYLAXIS: SubQ lovenox  GI PROPHYLAXIS: None  ADMISSION STATUS: Inpatient  CODE STATUS: Full  TOTAL TIME TAKING CARE OF THIS PATIENT: 45 minutes.   Khadim Lundberg FIELDING 01/09/2018, 11:58 PM  [employer]  [phone]  CC: Primary care physician; [facility]  Note:  This document was prepared using Dragon voice recognition software and may include unintentional dictation errors.

## 2018-01-09 NOTE — ED Provider Notes (Signed)
[facility] Emergency Department Provider Note ____________________________________________   First MD Initiated Contact with Patient 01/09/18 2206     (approximate)  I have reviewed the triage vital signs and the nursing notes.   HISTORY  Chief Complaint Drug Overdose  EM caveat: Patient unable to give history as a why she is presented she is seems acutely altered  HPI Erica Schmidt is a 57 y.o. female presents under somewhat unclear circumstance.  EMS reports they were called to the police department for patient who evidently had arrived to thereafter potentially overdosing on Klonopin, reportedly 3 tablets that were given to her by someone else and also concerns for acute alcohol intoxication.  EMS reports she was given 2 doses of Narcan 2 mg each without notable response.  She is been somnolent, but arousable, alerts to voice but primarily somnolent.  EMS unclear if this was an intended attempted harm or the exact circumstances about her presentation.  No one else is available to provide history at this time.  Patient reports she is not in any pain.  She cannot tell us what happened or why she presented though, and her history is felt to be very unreliable.  There is no report of altercation or trauma.   Past Medical History:  Diagnosis Date  . COPD (chronic obstructive pulmonary disease) (HCC)   . Emphysema of lung (HCC)   . Polysubstance abuse [facility](HCC)     Patient Active Problem List   Diagnosis Date Noted  . Overdose, undetermined intent, initial encounter 01/09/2018  . COPD (chronic obstructive pulmonary disease) (HCC) 01/09/2018  . HTN (hypertension) 01/09/2018    Past Surgical History:  Procedure Laterality Date  . NO PAST SURGERIES      Prior to Admission medications   Medication Sig Start Date End Date Taking? Authorizing Provider  albuterol (PROVENTIL HFA;VENTOLIN HFA) 108 (90 Base) MCG/ACT inhaler Inhale 1-2 puffs into the  lungs every 6 (six) hours as needed for wheezing or shortness of breath. 10/27/17   Domenick GongMortenson, Ashley, MD  Diphenhyd-Hydrocort-Nystatin (FIRST-DUKES MOUTHWASH) SUSP Use as directed 10 mLs in the mouth or throat 4 (four) times daily as needed. 10/27/17   Domenick GongMortenson, Ashley, MD  lidocaine (XYLOCAINE) 2 % solution Use as directed 10 mLs in the mouth or throat every 6 (six) hours as needed. Swish and spit. Do not swallow. 10/27/17   Domenick GongMortenson, Ashley, MD  Spacer/Aero-Holding Chambers (AEROCHAMBER PLUS) inhaler Use as instructed 10/27/17   Domenick GongMortenson, Ashley, MD    Allergies Patient has no known allergies.  Family History  Problem Relation Age of Onset  . Epilepsy Mother   . Emphysema Father   . Alcohol abuse Father     Social History Patient unable to provide social history as above.  Does appear to be a history of alcohol abuse as well as previous stated [facility]  History of alcohol dependence, COPD schizophrenia.  Review of Systems  EM caveat: Patient unable to provide  ____________________________________________   PHYSICAL EXAM:  VITAL SIGNS: ED Triage Vitals  Enc Vitals Group     BP 01/09/18 2200 (!) 141/91     Pulse Rate 01/09/18 2200 94     Resp 01/09/18 2200 12     Temp 01/09/18 2200 (!) 96.1 F (35.6 C)     Temp Source 01/09/18 2200 Oral     SpO2 01/09/18 2200 (!) 88 %     Weight 01/09/18 2203 110 lb (49.9 kg)  Height 01/09/18 2203 5' (1.524 m)     Head Circumference --      Peak Flow --      Pain Score 01/09/18 2202 9     Pain Loc --      Pain Edu? --      Excl. in GC? --     Constitutional: Alert to voice but overall very somnolent to slightly lethargic.  Is able to participate in some aspects of exam such as sitting up, but she appears somnolent.  Protecting her airway, sitting upright, moves intentionally but decreased overall level of alertness.  She appears sedated.  Eyes: Conjunctivae are slightly injected bilaterally.   Her pupils are midpoint Head: Atraumatic. Nose: No congestion/rhinnorhea. Mouth/Throat: Mucous membranes are moist. Neck: No stridor.   Cardiovascular: Normal rate, regular rhythm. Grossly normal heart sounds.  Good peripheral circulation. Respiratory: Normal respiratory effort.  No retractions. Lungs CTAB. Gastrointestinal: Soft and nontender. No distention. Musculoskeletal: No lower extremity tenderness nor edema. Neurologic: Speech is slightly slurred.  There is no focal deficits.  She moves all extremities to command, but does demonstrate some slurring of her speech, some lateral gaze ataxia bilaterally.  Gaze is conjugate.  Able to move extremities without focal deficit. Skin:  Skin is warm, dry and intact. No rash noted. Psychiatric: Mood and affect are flat, seems sedated.  Normal does not answer questions when asked about potential overdose or if there was any intent or took alcohol. ____________________________________________   LABS (all labs ordered are listed, but only abnormal results are displayed)  Labs Reviewed  COMPREHENSIVE METABOLIC PANEL - Abnormal; Notable for the following components:      Result Value   Glucose, Bld 171 (*)    Calcium 8.6 (*)    Total Protein 8.3 (*)    Total Bilirubin 0.2 (*)    All other components within normal limits  ACETAMINOPHEN LEVEL - Abnormal; Notable for the following components:   Acetaminophen (Tylenol), Serum <10 (*)    All other components within normal limits  ETHANOL - Abnormal; Notable for the following components:   Alcohol, Ethyl (B) 60 (*)    All other components within normal limits  CBC WITH DIFFERENTIAL/PLATELET - Abnormal; Notable for the following components:   RDW 15.8 (*)    All other components within normal limits  URINALYSIS, COMPLETE (UACMP) WITH MICROSCOPIC - Abnormal; Notable for the following components:   Color, Urine YELLOW (*)    APPearance CLEAR (*)    Protein, ur 100 (*)    All other components  within normal limits  SALICYLATE LEVEL  TROPONIN I  URINE DRUG SCREEN, QUALITATIVE ([facility] ONLY)  CBG MONITORING, ED  CBG MONITORING, ED   ____________________________________________  EKG  Reviewed enterotomy at 2200 Heart rate 95 9 QRS 80 QTc 450 Normal sinus rhythm, no evidence of ischemia.  Slight but nonspecific T wave abnormalities seen in multiple leads including the inferior which may be somewhat artifactual in nature. ____________________________________________  RADIOLOGY  Ct Head Wo Contrast  Result Date: 01/09/2018 CLINICAL DATA:  56 year old female with altered mental status. EXAM: CT HEAD WITHOUT CONTRAST TECHNIQUE: Contiguous axial images were obtained from the base of the skull through the vertex without intravenous contrast. COMPARISON:  None. FINDINGS: Brain: The ventricles and sulci appropriate size for patient's age. Mild periventricular and deep white matter chronic microvascular ischemic changes noted. There is no acute intracranial hemorrhage. No mass effect or midline shift. No extra-axial fluid collection. Vascular: No hyperdense vessel or unexpected calcification. Skull:  Normal. Negative for fracture or focal lesion. Sinuses/Orbits: No acute finding. Other: None IMPRESSION: 1. No acute intracranial pathology. 2. Mild chronic microvascular ischemic changes. Electronically Signed   By: Elgie Collard M.D.   On: 01/09/2018 22:31   Dg Chest Port 1 View  Result Date: 01/09/2018 CLINICAL DATA:  Hypoxia with altered mental status EXAM: PORTABLE CHEST 1 VIEW COMPARISON:  None. FINDINGS: Hyperinflation with emphysematous disease. No acute consolidation or effusion. Cardiomediastinal silhouette within normal limits. Aortic atherosclerosis. No pneumothorax. Old appearing right sixth and seventh rib deformities. IMPRESSION: No active disease.  Hyperinflation with emphysematous disease. Electronically Signed   By: Jasmine Pang M.D.   On: 01/09/2018 22:19    CT and chest x-ray  reviewed by me. ____________________________________________   PROCEDURES  Procedure(s) performed: None  Procedures  Critical Care performed: No   ____________________________________________   INITIAL IMPRESSION / ASSESSMENT AND PLAN / ED COURSE  Pertinent labs & imaging results that were available during my care of the patient were reviewed by me and considered in my medical decision making (see chart for details).  Patient presents for evaluation of somnolence, this is reportedly in the setting of a possible overdose.  Patient's daughter arrived later and reports that patient is a heavy abuser of medications including pain medications and benzodiazepines.  Also like some alcohol abuse as well for the last 20 or more years.  Patient's daughter concerned the patient may be at risk for self-harm, the tonight's intent was likely recreational.  Patient is overall somnolent, but protecting her airway requiring 2 L nasal cannula to maintain oxygen saturations in the 90s.  She is somnolent but hemodynamically stable with supplemental oxygen.  Lab work reviewed does elevate a therapeutic salicylate level, no known history of a salicylate ingestion and patient does not appear tachypneic or acutely acidotic.  Ongoing care discussed with Dr. Anne Hahn, due to the patient's somnolence, oxygen requirement, and reported acute ingestion and patient will be admitted to the hospitalist service for ongoing care and monitoring.  Discussed with Dr. Anne Hahn patient also placed under IVC, would anticipate psych consult once patient able to participate meaningfully.  Patient's daughter understanding of plan for admission.      ____________________________________________   FINAL CLINICAL IMPRESSION(S) / ED DIAGNOSES  Final diagnoses:  Altered mental status, unspecified altered mental status type  Drug overdose, undetermined intent, initial encounter      NEW MEDICATIONS STARTED DURING THIS  VISIT:  New Prescriptions   No medications on file     Note:  This document was prepared using Dragon voice recognition software and may include unintentional dictation errors.     Sharyn Creamer, MD 01/10/18 [phone]

## 2018-01-09 NOTE — ED Notes (Signed)
Pt daughter at bedside and reports she found the pt slumped over today. She reports the pt has been under a lot of stress and recently broke up with significant other. She is worried for the pt's safety and reports her mother has passively spoke of SI. Daughter worries about the pt living situation and that she has not eaten. MD Quale made aware.

## 2018-01-10 ENCOUNTER — Inpatient Hospital Stay: Payer: Medicaid Other

## 2018-01-10 DIAGNOSIS — F1310 Sedative, hypnotic or anxiolytic abuse, uncomplicated: Secondary | ICD-10-CM

## 2018-01-10 DIAGNOSIS — F1010 Alcohol abuse, uncomplicated: Secondary | ICD-10-CM

## 2018-01-10 DIAGNOSIS — T50904A Poisoning by unspecified drugs, medicaments and biological substances, undetermined, initial encounter: Secondary | ICD-10-CM

## 2018-01-10 LAB — URINALYSIS, COMPLETE (UACMP) WITH MICROSCOPIC
BACTERIA UA: NONE SEEN
BILIRUBIN URINE: NEGATIVE
Glucose, UA: NEGATIVE mg/dL
Hgb urine dipstick: NEGATIVE
Ketones, ur: NEGATIVE mg/dL
Leukocytes, UA: NEGATIVE
Nitrite: NEGATIVE
PROTEIN: 100 mg/dL — AB
SPECIFIC GRAVITY, URINE: 1.02 (ref 1.005–1.030)
SQUAMOUS EPITHELIAL / LPF: NONE SEEN (ref 0–5)
pH: 5 (ref 5.0–8.0)

## 2018-01-10 LAB — URINE DRUG SCREEN, QUALITATIVE (ARMC ONLY)
Amphetamines, Ur Screen: NOT DETECTED
BENZODIAZEPINE, UR SCRN: POSITIVE — AB
COCAINE METABOLITE, UR ~~LOC~~: NOT DETECTED
Cannabinoid 50 Ng, Ur ~~LOC~~: NOT DETECTED
MDMA (Ecstasy)Ur Screen: NOT DETECTED
Methadone Scn, Ur: NOT DETECTED
OPIATE, UR SCREEN: NOT DETECTED
PHENCYCLIDINE (PCP) UR S: NOT DETECTED
Tricyclic, Ur Screen: NOT DETECTED

## 2018-01-10 LAB — MRSA PCR SCREENING: MRSA by PCR: NEGATIVE

## 2018-01-10 LAB — TROPONIN I: Troponin I: <0.03 ng/mL (ref <0.03)

## 2018-01-10 LAB — CBC
HEMATOCRIT: 42 % (ref 35.0–47.0)
HEMOGLOBIN: 13.6 g/dL (ref 12.0–16.0)
MCH: 31.2 pg (ref 26.0–34.0)
MCHC: 32.4 g/dL (ref 32.0–36.0)
MCV: 96.4 fL (ref 80.0–100.0)
Platelets: 248 10*3/uL (ref 150–440)
RBC: 4.36 MIL/uL (ref 3.80–5.20)
RDW: 15.6 % — AB (ref 11.5–14.5)
WBC: 8.7 10*3/uL (ref 3.6–11.0)

## 2018-01-10 LAB — BLOOD GAS, ARTERIAL
ACID-BASE DEFICIT: 5.1 mmol/L — AB (ref 0.0–2.0)
Bicarbonate: 26.4 mmol/L (ref 20.0–28.0)
FIO2: 0.7
Mechanical Rate: 20
O2 SAT: 97.5 %
PEEP/CPAP: 5 cmH2O
PH ART: 7.11 — AB (ref 7.350–7.450)
Patient temperature: 37
VT: 400 mL
pCO2 arterial: 83 mmHg (ref 32.0–48.0)
pO2, Arterial: 124 mmHg — ABNORMAL HIGH (ref 83.0–108.0)

## 2018-01-10 LAB — MAGNESIUM: Magnesium: 1.4 mg/dL — ABNORMAL LOW (ref 1.7–2.4)

## 2018-01-10 LAB — COMPREHENSIVE METABOLIC PANEL
ALK PHOS: 48 U/L (ref 38–126)
ALT: 26 U/L (ref 0–44)
ANION GAP: 4 — AB (ref 5–15)
AST: 48 U/L — ABNORMAL HIGH (ref 15–41)
Albumin: 3 g/dL — ABNORMAL LOW (ref 3.5–5.0)
BILIRUBIN TOTAL: 0.5 mg/dL (ref 0.3–1.2)
BUN: 14 mg/dL (ref 6–20)
CALCIUM: 7.9 mg/dL — AB (ref 8.9–10.3)
CO2: 28 mmol/L (ref 22–32)
Chloride: 114 mmol/L — ABNORMAL HIGH (ref 98–111)
Creatinine, Ser: 0.56 mg/dL (ref 0.44–1.00)
GFR calc Af Amer: >60 mL/min (ref >60)
GFR calc non Af Amer: >60 mL/min (ref >60)
Glucose, Bld: 133 mg/dL — ABNORMAL HIGH (ref 70–99)
POTASSIUM: 4.3 mmol/L (ref 3.5–5.1)
SODIUM: 146 mmol/L — AB (ref 135–145)
Total Protein: 6.2 g/dL — ABNORMAL LOW (ref 6.5–8.1)

## 2018-01-10 LAB — GLUCOSE, CAPILLARY
GLUCOSE-CAPILLARY: 115 mg/dL — AB (ref 70–99)
GLUCOSE-CAPILLARY: 155 mg/dL — AB (ref 70–99)
GLUCOSE-CAPILLARY: 169 mg/dL — AB (ref 70–99)
Glucose-Capillary: 172 mg/dL — ABNORMAL HIGH (ref 70–99)

## 2018-01-10 LAB — BASIC METABOLIC PANEL
ANION GAP: 6 (ref 5–15)
BUN: 15 mg/dL (ref 6–20)
CHLORIDE: 108 mmol/L (ref 98–111)
CO2: 31 mmol/L (ref 22–32)
Calcium: 8.2 mg/dL — ABNORMAL LOW (ref 8.9–10.3)
Creatinine, Ser: 0.37 mg/dL — ABNORMAL LOW (ref 0.44–1.00)
GFR calc Af Amer: >60 mL/min (ref >60)
GFR calc non Af Amer: >60 mL/min (ref >60)
Glucose, Bld: 124 mg/dL — ABNORMAL HIGH (ref 70–99)
POTASSIUM: 4.4 mmol/L (ref 3.5–5.1)
SODIUM: 145 mmol/L (ref 135–145)

## 2018-01-10 LAB — SALICYLATE LEVEL: Salicylate Lvl: 11.8 mg/dL (ref 2.8–30.0)

## 2018-01-10 LAB — CBC WITH DIFFERENTIAL/PLATELET
BASOS PCT: 0 %
Basophils Absolute: 0.1 10*3/uL (ref 0–0.1)
EOS ABS: 0 10*3/uL (ref 0–0.7)
Eosinophils Relative: 0 %
HCT: 42.5 % (ref 35.0–47.0)
HEMOGLOBIN: 13.1 g/dL (ref 12.0–16.0)
LYMPHS ABS: 0.3 10*3/uL — AB (ref 1.0–3.6)
Lymphocytes Relative: 1 %
MCH: 30.6 pg (ref 26.0–34.0)
MCHC: 30.8 g/dL — AB (ref 32.0–36.0)
MCV: 99.3 fL (ref 80.0–100.0)
MONO ABS: 1.3 10*3/uL — AB (ref 0.2–0.9)
MONOS PCT: 5 %
NEUTROS PCT: 94 %
Neutro Abs: 22.8 10*3/uL — ABNORMAL HIGH (ref 1.4–6.5)
Platelets: 250 10*3/uL (ref 150–440)
RBC: 4.28 MIL/uL (ref 3.80–5.20)
RDW: 15.8 % — AB (ref 11.5–14.5)
WBC: 24.5 10*3/uL — ABNORMAL HIGH (ref 3.6–11.0)

## 2018-01-10 LAB — PROTIME-INR
INR: 0.99
PROTHROMBIN TIME: 13 s (ref 11.4–15.2)

## 2018-01-10 LAB — PHOSPHORUS: PHOSPHORUS: 3 mg/dL (ref 2.5–4.6)

## 2018-01-10 LAB — TRIGLYCERIDES: Triglycerides: 111 mg/dL (ref <150)

## 2018-01-10 MED ORDER — SODIUM CHLORIDE 0.9 % IV SOLN: 4.0000 g | Freq: Once | INTRAVENOUS | Status: DC

## 2018-01-10 MED ORDER — PROPOFOL 1000 MG/100ML IV EMUL
5.0000 ug/kg/min | INTRAVENOUS | Status: DC
  Administered 2018-01-10: 30 ug/kg/min via INTRAVENOUS
  Administered 2018-01-11 (×2): 40 ug/kg/min via INTRAVENOUS
  Administered 2018-01-11: 25 ug/kg/min via INTRAVENOUS
  Administered 2018-01-12: 50 ug/kg/min via INTRAVENOUS
  Administered 2018-01-12 – 2018-01-13 (×5): 60 ug/kg/min via INTRAVENOUS
  Filled 2018-01-10 (×9): qty 100

## 2018-01-10 MED ORDER — DEXMEDETOMIDINE HCL IN NACL 400 MCG/100ML IV SOLN: 0.2000 ug/kg/h | INTRAVENOUS | Status: DC

## 2018-01-10 MED ORDER — VANCOMYCIN HCL IN DEXTROSE 750-5 MG/150ML-% IV SOLN
750.0000 mg | Freq: Once | INTRAVENOUS | Status: AC
  Administered 2018-01-10: 750 mg via INTRAVENOUS
  Filled 2018-01-10: qty 150

## 2018-01-10 MED ORDER — CHLORHEXIDINE GLUCONATE 0.12% ORAL RINSE (MEDLINE KIT)
15.0000 mL | Freq: Two times a day (BID) | OROMUCOSAL | Status: DC
  Administered 2018-01-10 – 2018-01-15 (×9): 15 mL via OROMUCOSAL

## 2018-01-10 MED ORDER — ONDANSETRON HCL 4 MG PO TABS: 4.0000 mg | ORAL_TABLET | Freq: Four times a day (QID) | ORAL | Status: DC | PRN

## 2018-01-10 MED ORDER — THIAMINE HCL 100 MG/ML IJ SOLN
Freq: Once | INTRAVENOUS | Status: AC
  Administered 2018-01-10: 20:00:00 via INTRAVENOUS
  Filled 2018-01-10: qty 1000

## 2018-01-10 MED ORDER — INSULIN ASPART 100 UNIT/ML ~~LOC~~ SOLN
0.0000 [IU] | SUBCUTANEOUS | Status: DC
  Administered 2018-01-10: 2 [IU] via SUBCUTANEOUS
  Administered 2018-01-11 – 2018-01-13 (×6): 1 [IU] via SUBCUTANEOUS
  Filled 2018-01-10 (×5): qty 1

## 2018-01-10 MED ORDER — ORAL CARE MOUTH RINSE
15.0000 mL | OROMUCOSAL | Status: DC
  Administered 2018-01-10 – 2018-01-13 (×30): 15 mL via OROMUCOSAL

## 2018-01-10 MED ORDER — FOLIC ACID 1 MG PO TABS
1.0000 mg | ORAL_TABLET | Freq: Every day | ORAL | Status: DC
  Administered 2018-01-11 – 2018-01-16 (×6): 1 mg via ORAL
  Filled 2018-01-10 (×6): qty 1

## 2018-01-10 MED ORDER — VITAMIN B-1 100 MG PO TABS
100.0000 mg | ORAL_TABLET | Freq: Every day | ORAL | Status: DC
  Administered 2018-01-11 – 2018-01-16 (×5): 100 mg via ORAL
  Filled 2018-01-10 (×5): qty 1

## 2018-01-10 MED ORDER — MAGNESIUM SULFATE 4 GM/100ML IV SOLN
4.0000 g | Freq: Once | INTRAVENOUS | Status: AC
  Administered 2018-01-10: 4 g via INTRAVENOUS
  Filled 2018-01-10: qty 100

## 2018-01-10 MED ORDER — ACETAMINOPHEN 325 MG PO TABS
650.0000 mg | ORAL_TABLET | Freq: Four times a day (QID) | ORAL | Status: DC | PRN
  Administered 2018-01-14 – 2018-01-16 (×6): 650 mg via ORAL
  Filled 2018-01-10 (×6): qty 2

## 2018-01-10 MED ORDER — VANCOMYCIN HCL 500 MG IV SOLR
500.0000 mg | INTRAVENOUS | Status: DC
  Administered 2018-01-11: 500 mg via INTRAVENOUS
  Filled 2018-01-10 (×2): qty 500

## 2018-01-10 MED ORDER — THIAMINE HCL 100 MG/ML IJ SOLN
100.0000 mg | Freq: Every day | INTRAMUSCULAR | Status: DC
  Administered 2018-01-13: 100 mg via INTRAVENOUS
  Filled 2018-01-10: qty 2

## 2018-01-10 MED ORDER — ONDANSETRON HCL 4 MG/2ML IJ SOLN: 4.0000 mg | Freq: Four times a day (QID) | INTRAMUSCULAR | Status: DC | PRN

## 2018-01-10 MED ORDER — PRO-STAT SUGAR FREE PO LIQD
30.0000 mL | Freq: Two times a day (BID) | ORAL | Status: DC
  Administered 2018-01-10 – 2018-01-11 (×2): 30 mL

## 2018-01-10 MED ORDER — SODIUM CHLORIDE 0.9 % IV SOLN
3.0000 g | Freq: Four times a day (QID) | INTRAVENOUS | Status: DC
  Administered 2018-01-10 – 2018-01-13 (×12): 3 g via INTRAVENOUS
  Filled 2018-01-10 (×17): qty 3

## 2018-01-10 MED ORDER — IPRATROPIUM-ALBUTEROL 0.5-2.5 (3) MG/3ML IN SOLN
3.0000 mL | Freq: Four times a day (QID) | RESPIRATORY_TRACT | Status: DC
  Administered 2018-01-10 – 2018-01-15 (×18): 3 mL via RESPIRATORY_TRACT
  Filled 2018-01-10 (×18): qty 3

## 2018-01-10 MED ORDER — ALBUTEROL SULFATE (2.5 MG/3ML) 0.083% IN NEBU: 2.5000 mg | INHALATION_SOLUTION | Freq: Four times a day (QID) | RESPIRATORY_TRACT | Status: DC | PRN

## 2018-01-10 MED ORDER — ENOXAPARIN SODIUM 40 MG/0.4ML ~~LOC~~ SOLN: 40.0000 mg | SUBCUTANEOUS | Status: DC

## 2018-01-10 MED ORDER — MIDAZOLAM HCL 2 MG/2ML IJ SOLN
2.0000 mg | Freq: Once | INTRAMUSCULAR | Status: AC
  Administered 2018-01-10: 2 mg via INTRAVENOUS

## 2018-01-10 MED ORDER — ENOXAPARIN SODIUM 30 MG/0.3ML ~~LOC~~ SOLN
30.0000 mg | SUBCUTANEOUS | Status: DC
  Administered 2018-01-10 – 2018-01-12 (×3): 30 mg via SUBCUTANEOUS
  Filled 2018-01-10 (×3): qty 0.3

## 2018-01-10 MED ORDER — ADULT MULTIVITAMIN W/MINERALS CH
1.0000 | ORAL_TABLET | Freq: Every day | ORAL | Status: DC
  Administered 2018-01-11: 1 via ORAL
  Filled 2018-01-10: qty 1

## 2018-01-10 MED ORDER — NOREPINEPHRINE 4 MG/250ML-% IV SOLN
0.0000 ug/min | INTRAVENOUS | Status: DC
  Administered 2018-01-10: 10 ug/min via INTRAVENOUS
  Administered 2018-01-11: 8 ug/min via INTRAVENOUS
  Administered 2018-01-11: 7 ug/min via INTRAVENOUS
  Administered 2018-01-12: 4 ug/min via INTRAVENOUS
  Filled 2018-01-10 (×3): qty 250

## 2018-01-10 MED ORDER — ACETAMINOPHEN 650 MG RE SUPP: 650.0000 mg | Freq: Four times a day (QID) | RECTAL | Status: DC | PRN

## 2018-01-10 MED ORDER — SODIUM CHLORIDE 0.9 % IV SOLN
INTRAVENOUS | Status: DC
  Administered 2018-01-10: 03:00:00 via INTRAVENOUS

## 2018-01-10 MED ORDER — FLUMAZENIL 0.5 MG/5ML IV SOLN
INTRAVENOUS | Status: AC
  Filled 2018-01-10: qty 5

## 2018-01-10 MED ORDER — NALOXONE HCL 0.4 MG/ML IJ SOLN
INTRAMUSCULAR | Status: AC
  Filled 2018-01-10: qty 1

## 2018-01-10 MED ORDER — MIDAZOLAM HCL 2 MG/2ML IJ SOLN
INTRAMUSCULAR | Status: AC
  Administered 2018-01-10: 2 mg via INTRAVENOUS
  Filled 2018-01-10: qty 2

## 2018-01-10 MED ORDER — VITAL HIGH PROTEIN PO LIQD
1000.0000 mL | ORAL | Status: DC
  Administered 2018-01-10 – 2018-01-13 (×4): 1000 mL

## 2018-01-10 MED ORDER — BUDESONIDE 0.25 MG/2ML IN SUSP
0.2500 mg | Freq: Two times a day (BID) | RESPIRATORY_TRACT | Status: DC
  Administered 2018-01-10 – 2018-01-13 (×6): 0.25 mg via RESPIRATORY_TRACT
  Filled 2018-01-10 (×6): qty 2

## 2018-01-10 NOTE — Progress Notes (Signed)
Assisted with intubation ambu bag mask with 100% oxygen.  Tube secured with comercial tube holder placed at 22cm at lip before transferring patient to CCU.

## 2018-01-10 NOTE — Consult Note (Signed)
Reason for Consult: Respiratory Failure / Apnea / OD / Intubated for Airway Protection Referring Physician: Dr. Levon Schmidt is an 57 y.o. female.  HPI: Erica Schmidt is a 57 year old female with a past medical history remarkable for COPD/emphysema, polysubstance abuse, recent stressors, found slumped over yesterday, was subsequently brought to the emergency department, questionable concern for suicidal ideation,urine drug screen was positive for benzodiazepine, alcohol was 60, salicylates was 25.9, was admitted to the floor, rapid response was called this afternoon as patient became apneic, unresponsive, unable to protect airway, was given Narcan and flumazenil,subsequently intubated by respiratory therapy on the floor and brought to the intensive care unit. Excessive secretions were noted from endotracheal tube.  Past Medical History:  Diagnosis Date  . COPD (chronic obstructive pulmonary disease) ([place])   . Emphysema of lung ([place])   . Polysubstance abuse [facility])     Past Surgical History:  Procedure Laterality Date  . NO PAST SURGERIES      Family History  Problem Relation Age of Onset  . Epilepsy Mother   . Emphysema Father   . Alcohol abuse Father     Social History:  reports that she has been smoking cigarettes.  She has been smoking about 0.50 packs per day. She has never used smokeless tobacco. She reports that she has current or past drug history. She reports that she does not drink alcohol.  Allergies: No Known Allergies  Medications: I have reviewed the patient's current medications.  Results for orders placed or performed during the hospital encounter of 01/09/18 (from the past 48 hour(s))  Comprehensive metabolic panel     Status: Abnormal   Collection Time: 01/09/18 10:12 PM  Result Value Ref Range   Sodium 144 135 - 145 mmol/L   Potassium 3.7 3.5 - 5.1 mmol/L   Chloride 104 98 - 111 mmol/L    Comment: Please note change in reference range.   CO2 29 22  - 32 mmol/L   Glucose, Bld 171 (H) 70 - 99 mg/dL    Comment: Please note change in reference range.   BUN 17 6 - 20 mg/dL    Comment: Please note change in reference range.   Creatinine, Ser 0.73 0.44 - 1.00 mg/dL   Calcium 8.6 (L) 8.9 - 10.3 mg/dL   Total Protein 8.3 (H) 6.5 - 8.1 g/dL   Albumin 3.8 3.5 - 5.0 g/dL   AST 28 15 - 41 U/L   ALT 17 0 - 44 U/L    Comment: Please note change in reference range.   Alkaline Phosphatase 51 38 - 126 U/L   Total Bilirubin 0.2 (L) 0.3 - 1.2 mg/dL   GFR calc non Af Amer >60 >60 mL/min   GFR calc Af Amer >60 >60 mL/min    Comment: (NOTE) The eGFR has been calculated using the CKD EPI equation. This calculation has not been validated in all clinical situations. eGFR's persistently <60 mL/min signify possible Chronic Kidney Disease.    Anion gap 11 5 - 15    Comment: Performed at [facility], [address] [place], [place] [address]  Salicylate level     Status: None   Collection Time: 01/09/18 10:12 PM  Result Value Ref Range   Salicylate Lvl 56.4 2.8 - 30.0 mg/dL    Comment: Performed at [facility], [address], [place], [place] [address]  Acetaminophen level     Status: Abnormal   Collection Time: 01/09/18 10:12 PM  Result Value  Ref Range   Acetaminophen (Tylenol), Serum <10 (L) 10 - 30 ug/mL    Comment: (NOTE) Therapeutic concentrations vary significantly. A range of 10-30 ug/mL  may be an effective concentration for many patients. However, some  are best treated at concentrations outside of this range. Acetaminophen concentrations >150 ug/mL at 4 hours after ingestion  and >50 ug/mL at 12 hours after ingestion are often associated with  toxic reactions. Performed at [facility], [address] [place], [place] [address]   Ethanol     Status: Abnormal   Collection Time: 01/09/18 10:12 PM  Result Value Ref Range   Alcohol, Ethyl (B) 60 (H) <10 mg/dL    Comment: (NOTE) Lowest detectable limit  for serum alcohol is 10 mg/dL. For medical purposes only. Performed at [facility], [address] [place], [place] [address]   Urine Drug Screen, Qualitative     Status: Abnormal   Collection Time: 01/09/18 10:12 PM  Result Value Ref Range   Tricyclic, Ur Screen NONE DETECTED NONE DETECTED   Amphetamines, Ur Screen NONE DETECTED NONE DETECTED   MDMA (Ecstasy)Ur Screen NONE DETECTED NONE DETECTED   Cocaine Metabolite,Ur [place] NONE DETECTED NONE DETECTED   Opiate, Ur Screen NONE DETECTED NONE DETECTED   Phencyclidine (PCP) Ur S NONE DETECTED NONE DETECTED   Cannabinoid 50 Ng, Ur [place] NONE DETECTED NONE DETECTED   Barbiturates, Ur Screen (A) NONE DETECTED    Result not available. Reagent lot number recalled by manufacturer.   Benzodiazepine, Ur Scrn POSITIVE (A) NONE DETECTED   Methadone Scn, Ur NONE DETECTED NONE DETECTED    Comment: (NOTE) Tricyclics + metabolites, urine    Cutoff 1000 ng/mL Amphetamines + metabolites, urine  Cutoff 1000 ng/mL MDMA (Ecstasy), urine              Cutoff 500 ng/mL Cocaine Metabolite, urine          Cutoff 300 ng/mL Opiate + metabolites, urine        Cutoff 300 ng/mL Phencyclidine (PCP), urine         Cutoff 25 ng/mL Cannabinoid, urine                 Cutoff 50 ng/mL Barbiturates + metabolites, urine  Cutoff 200 ng/mL Benzodiazepine, urine              Cutoff 200 ng/mL Methadone, urine                   Cutoff 300 ng/mL The urine drug screen provides only a preliminary, unconfirmed analytical test result and should not be used for non-medical purposes. Clinical consideration and professional judgment should be applied to any positive drug screen result due to possible interfering substances. A more specific alternate chemical method must be used in order to obtain a confirmed analytical result. Gas chromatography / mass spectrometry (GC/MS) is the preferred confirmat ory method. Performed at [facility], [address]  [place], [place] [address]   CBC WITH DIFFERENTIAL     Status: Abnormal   Collection Time: 01/09/18 10:12 PM  Result Value Ref Range   WBC 8.7 3.6 - 11.0 K/uL   RBC 4.69 3.80 - 5.20 MIL/uL   Hemoglobin 14.7 12.0 - 16.0 g/dL   HCT 44.4 35.0 - 47.0 %   MCV 94.6 80.0 - 100.0 fL   MCH 31.4 26.0 - 34.0 pg   MCHC 33.2 32.0 - 36.0 g/dL   RDW 15.8 (H) 11.5 - 14.5 %  Platelets 273 150 - 440 K/uL   Neutrophils Relative % 75 %   Neutro Abs 6.5 1.4 - 6.5 K/uL   Lymphocytes Relative 17 %   Lymphs Abs 1.5 1.0 - 3.6 K/uL   Monocytes Relative 7 %   Monocytes Absolute 0.6 0.2 - 0.9 K/uL   Eosinophils Relative 0 %   Eosinophils Absolute 0.0 0 - 0.7 K/uL   Basophils Relative 1 %   Basophils Absolute 0.1 0 - 0.1 K/uL    Comment: Performed at [facility], [address] [place], [place] [address]  Troponin I     Status: None   Collection Time: 01/09/18 10:12 PM  Result Value Ref Range   Troponin I <0.03 <0.03 ng/mL    Comment: Performed at [facility], [address] [place], [place] [address]  Urinalysis, Complete w Microscopic     Status: Abnormal   Collection Time: 01/09/18 10:26 PM  Result Value Ref Range   Color, Urine YELLOW (A) YELLOW   APPearance CLEAR (A) CLEAR   Specific Gravity, Urine 1.020 1.005 - 1.030   pH 5.0 5.0 - 8.0   Glucose, UA NEGATIVE NEGATIVE mg/dL   Hgb urine dipstick NEGATIVE NEGATIVE   Bilirubin Urine NEGATIVE NEGATIVE   Ketones, ur NEGATIVE NEGATIVE mg/dL   Protein, ur 100 (A) NEGATIVE mg/dL   Nitrite NEGATIVE NEGATIVE   Leukocytes, UA NEGATIVE NEGATIVE   RBC / HPF 0-5 0 - 5 RBC/hpf   WBC, UA 0-5 0 - 5 WBC/hpf   Bacteria, UA NONE SEEN NONE SEEN   Squamous Epithelial / LPF NONE SEEN 0 - 5   Mucus PRESENT    Hyaline Casts, UA PRESENT     Comment: Performed at [facility], [address], [place], [place] [address]  Basic metabolic panel     Status: Abnormal   Collection Time: 01/10/18  6:04 AM  Result Value Ref Range   Sodium  145 135 - 145 mmol/L   Potassium 4.4 3.5 - 5.1 mmol/L   Chloride 108 98 - 111 mmol/L    Comment: Please note change in reference range.   CO2 31 22 - 32 mmol/L   Glucose, Bld 124 (H) 70 - 99 mg/dL    Comment: Please note change in reference range.   BUN 15 6 - 20 mg/dL    Comment: Please note change in reference range.   Creatinine, Ser 0.37 (L) 0.44 - 1.00 mg/dL   Calcium 8.2 (L) 8.9 - 10.3 mg/dL   GFR calc non Af Amer >60 >60 mL/min   GFR calc Af Amer >60 >60 mL/min    Comment: (NOTE) The eGFR has been calculated using the CKD EPI equation. This calculation has not been validated in all clinical situations. eGFR's persistently <60 mL/min signify possible Chronic Kidney Disease.    Anion gap 6 5 - 15    Comment: Performed at [facility], [address] [place], [place] [address]  CBC     Status: Abnormal   Collection Time: 01/10/18  6:04 AM  Result Value Ref Range   WBC 8.7 3.6 - 11.0 K/uL   RBC 4.36 3.80 - 5.20 MIL/uL   Hemoglobin 13.6 12.0 - 16.0 g/dL   HCT 42.0 35.0 - 47.0 %   MCV 96.4 80.0 - 100.0 fL   MCH 31.2 26.0 - 34.0 pg   MCHC 32.4 32.0 - 36.0 g/dL   RDW 15.6 (H) 11.5 - 14.5 %   Platelets 248 150 - 440  K/uL    Comment: Performed at [facility], [place], [place] [address]  Salicylate level     Status: None   Collection Time: 01/10/18  6:04 AM  Result Value Ref Range   Salicylate Lvl 00.5 2.8 - 30.0 mg/dL    Comment: Performed at [facility], [place], [place] [address]    Ct Head Wo Contrast  Result Date: 01/09/2018 CLINICAL DATA:  57 year old female with altered mental status. EXAM: CT HEAD WITHOUT CONTRAST TECHNIQUE: Contiguous axial images were obtained from the base of the skull through the vertex without intravenous contrast. COMPARISON:  None. FINDINGS: Brain: The ventricles and sulci appropriate size for patient's age. Mild periventricular and deep white matter chronic microvascular ischemic  changes noted. There is no acute intracranial hemorrhage. No mass effect or midline shift. No extra-axial fluid collection. Vascular: No hyperdense vessel or unexpected calcification. Skull: Normal. Negative for fracture or focal lesion. Sinuses/Orbits: No acute finding. Other: None IMPRESSION: 1. No acute intracranial pathology. 2. Mild chronic microvascular ischemic changes. Electronically Signed   By: Anner Crete M.D.   On: 01/09/2018 22:31   Dg Chest Port 1 View  Result Date: 01/09/2018 CLINICAL DATA:  Hypoxia with altered mental status EXAM: PORTABLE CHEST 1 VIEW COMPARISON:  None. FINDINGS: Hyperinflation with emphysematous disease. No acute consolidation or effusion. Cardiomediastinal silhouette within normal limits. Aortic atherosclerosis. No pneumothorax. Old appearing right sixth and seventh rib deformities. IMPRESSION: No active disease.  Hyperinflation with emphysematous disease. Electronically Signed   By: Donavan Foil M.D.   On: 01/09/2018 22:19    ROS Blood pressure (!) 172/121, pulse 94, temperature 97.6 F (36.4 C), temperature source Axillary, resp. rate 18, height 5' 2"  (1.575 m), weight 94 lb 5.7 oz (42.8 kg), SpO2 99 %. Physical Exam  Elderly appearing female, unresponsive, being bagged, status post intubation in the intensive care unit Vital signs: Please see the above listed vital signs HEENT: Patient is edentulous, endotracheal tube in place, no distended neck veins, trachea midline Cardiovascular: Regular rhythm, tachycardic Pulmonary: Prolonged expiratory phase, diffuse coarse rhonchi and central secretions noted Abdominal: Positive bowel sounds, soft exam Extremities: No clubbing cyanosis or edema noted Neurologic: Patient is somnolent but starting to wake up Cutaneous: No rashes or lesions noted  Assessment/Plan:  Respiratory failure. Patient's urine drug screen was noted, positive for benzodiazepine and alcohol, found unresponsive on the floor, most likely  aspiration as the patient's endotracheal tube is full of secretions. We'll place on mechanical ventilation protocol, will perform emergent bronchoscopy to clear central airways and send respiratory culture will place on propofol, antibiotic therapy to include vancomycin and Unasyn, albuterol, Atrovent, Solu-Medrol.  Concern for impending alcohol and benzodiazepine withdrawal. We'll place on Cipro protocol, fluid resuscitation, folate, multivitamin, thiamine, will obtain CBC, BMP and electrolytes.  Caran Storck 01/10/2018, 6:08 PM

## 2018-01-10 NOTE — Progress Notes (Signed)
Patient is intubated for respiratory arrest with 8.0 mm ET tube. Placed at 22 cm at lip mark. Position verified by End tidal co2 monitor and auscultation.

## 2018-01-10 NOTE — Progress Notes (Signed)
Initial Nutrition Assessment  DOCUMENTATION CODES:   Severe malnutrition in context of chronic illness  INTERVENTION:   RD will order supplements when diet advanced  Recommend MVI, thiamine, and folic acid daily in setting of etoh abuse  Recommend dysphagia 3 diet as pt is lacking many teeth   NUTRITION DIAGNOSIS:   Severe Malnutrition related to chronic illness, social / environmental circumstances(COPD, polysubstance abuse ) as evidenced by moderate to severe fat and muscle depletions.  GOAL:   Patient will meet greater than or equal to 90% of their needs  MONITOR:   Diet advancement, Labs, Weight trends, Skin, I & O's  REASON FOR ASSESSMENT:   Other (Comment)(low BMI)    ASSESSMENT:   57 y/o female with h/o substance abuse, COPD, schizophrenia admitted with overdose   Visited pt's room today; sitter at bedside. Pt sleeping at time of RD visit; RD did not wake pt. Pt currently NPO. Suspect poor appetite and oral intake at baseline r/t substance abuse. Pt with severe muscle and fat depletions. Per chart, pt has lost 7lbs(7%) since September 2018; RD is unsure how recent the weight loss occurred. Pt noted to have poor dentition and is lacking many teeth; recommend dysphagia 3 diet when advanced. RD will order supplements when diet advanced; recommend MVI, thiamine and folic acid in setting of etoh abuse.    Medications reviewed and include: lovenox  Labs reviewed: creat 0.37(L), Ca 8.2(L)  NUTRITION - FOCUSED PHYSICAL EXAM:    Most Recent Value  Orbital Region  Mild depletion  Upper Arm Region  Severe depletion  Thoracic and Lumbar Region  Unable to assess  Buccal Region  Moderate depletion  Temple Region  Mild depletion  Clavicle Bone Region  Severe depletion  Clavicle and Acromion Bone Region  Severe depletion  Scapular Bone Region  Unable to assess  Dorsal Hand  Moderate depletion  Patellar Region  Severe depletion  Anterior Thigh Region  Severe depletion   Posterior Calf Region  Severe depletion  Edema (RD Assessment)  None  Hair  Reviewed  Eyes  Reviewed  Mouth  Reviewed  Skin  Reviewed  Nails  Reviewed     Diet Order:   Diet Order           Diet NPO time specified  Diet effective now         EDUCATION NEEDS:   Not appropriate for education at this time  Skin:  Skin Assessment: Reviewed RN Assessment  Last BM:  unknown   Height:   Ht Readings from Last 1 Encounters:  01/10/18 5\' 2"  (1.575 m)    Weight:   Wt Readings from Last 1 Encounters:  01/10/18 94 lb 5.7 oz (42.8 kg)    Ideal Body Weight:  50 kg  BMI:  Body mass index is 17.26 kg/m.  Estimated Nutritional Needs:   Kcal:  1300-1500kcal/day   Protein:  64-73g/day   Fluid:  >1.3L/day   Betsey Holidayasey Tavaris Eudy MS, RD, LDN Pager #- [phone] Office#- [phone] After Hours Pager: [phone]

## 2018-01-10 NOTE — Progress Notes (Signed)
Lovenox pharmacy dose adjustment CrCl 51.8 ml/min Pt Wt. 42.8 kg  Lovenox 40 mg subq daily ordered. Will decrease to lovenox 30 mg subq daily.  Thomasene Rippleavid Kamarion Zagami, PharmD, BCPS Clinical Pharmacist 01/10/2018

## 2018-01-10 NOTE — Progress Notes (Signed)
Patient ID: Paulino RilyDeborah L Mcclellan, female   DOB: Sep 12, 1960, 57 y.o.   MRN: [id number] Pulmonary/critical care attending  Procedure note Bronchoscopy Indication: Severe aspiration Emergently performed Patient has been intubated and perform 2 endotracheal tube. Propofol is being infused for sedation Bronchoscope inserted through an adapter into the endotracheal tube were terminates 3 cm above the carina. The endotracheal tube and airways are coated with mucopurulence. The right mainstem is partially occluded with thick secretions. Bronchoscope was used to clear central airways to include trachea, right mainstem bronchus, right upper lobe, bronchus intermedius, right middle lobe and right lower lobe bronchus no obstructing lesions other than thick tenacious copious secretions consistent with aspiration Bronchoscope pulled back to the level of the carina and inserted into the left mainstem bronchus. Less secretions noted however still present. All airways were visualized and cleared of secretions. We'll send appropriate specimen to lab Patient tolerated procedure well without any desaturation. Pending stat portable chest x-ray postintubation  Tora KindredJohn Craig Ionescu, D.O.

## 2018-01-10 NOTE — Progress Notes (Signed)
[employer] [facility] -  at [facility]   PATIENT NAME: Erica Schmidt    MR#:  [id number]  DATE OF BIRTH:  01-Nov-1960  SUBJECTIVE:  CHIEF COMPLAINT:   Chief Complaint  Patient presents with  . Drug Overdose   Overdose. Unclear if this was due to abusing of her medications or suicidal. Patient is slowly clearing mental status. Ambulated to the bathroom with help.  REVIEW OF SYSTEMS:    Review of Systems  Unable to perform ROS: Mental status change    DRUG ALLERGIES:  No Known Allergies  VITALS:  Blood pressure 125/84, pulse (!) 101, temperature (!) 97.1 F (36.2 C), temperature source Axillary, resp. rate 18, height 5\' 2"  (1.575 m), weight 42.8 kg (94 lb 5.7 oz), SpO2 92 %.  PHYSICAL EXAMINATION:   Physical Exam  GENERAL:  57 y.o.-year-old patient lying in the bed with no acute distress.  EYES: Pupils equal, round, reactive to light and accommodation. No scleral icterus. Extraocular muscles intact.  HEENT: Head atraumatic, normocephalic. Oropharynx and nasopharynx clear.  NECK:  Supple, no jugular venous distention. No thyroid enlargement, no tenderness.  LUNGS: Normal breath sounds bilaterally, no wheezing, rales, rhonchi. No use of accessory muscles of respiration.  CARDIOVASCULAR: S1, S2 normal. No murmurs, rubs, or gallops.  ABDOMEN: Soft, nontender, nondistended. Bowel sounds present. No organomegaly or mass.  EXTREMITIES: No cyanosis, clubbing or edema b/l.    NEUROLOGIC: Not following instructions PSYCHIATRIC: The patient is drowsy SKIN: No obvious rash, lesion, or ulcer.   LABORATORY PANEL:   CBC Recent Labs  Lab 01/10/18 0604  WBC 8.7  HGB 13.6  HCT 42.0  PLT 248   ------------------------------------------------------------------------------------------------------------------ Chemistries  Recent Labs  Lab 01/09/18 2212 01/10/18 0604  NA 144 145  K 3.7 4.4  CL 104 108  CO2 29 31  GLUCOSE 171* 124*  BUN 17 15  CREATININE  0.73 0.37*  CALCIUM 8.6* 8.2*  AST 28  --   ALT 17  --   ALKPHOS 51  --   BILITOT 0.2*  --    ------------------------------------------------------------------------------------------------------------------  Cardiac Enzymes Recent Labs  Lab 01/09/18 2212  TROPONINI <0.03   ------------------------------------------------------------------------------------------------------------------  RADIOLOGY:  Ct Head Wo Contrast  Result Date: 01/09/2018 CLINICAL DATA:  57 year old female with altered mental status. EXAM: CT HEAD WITHOUT CONTRAST TECHNIQUE: Contiguous axial images were obtained from the base of the skull through the vertex without intravenous contrast. COMPARISON:  None. FINDINGS: Brain: The ventricles and sulci appropriate size for patient's age. Mild periventricular and deep white matter chronic microvascular ischemic changes noted. There is no acute intracranial hemorrhage. No mass effect or midline shift. No extra-axial fluid collection. Vascular: No hyperdense vessel or unexpected calcification. Skull: Normal. Negative for fracture or focal lesion. Sinuses/Orbits: No acute finding. Other: None IMPRESSION: 1. No acute intracranial pathology. 2. Mild chronic microvascular ischemic changes. Electronically Signed   By: Elgie Collard M.D.   On: 01/09/2018 22:31   Dg Chest Port 1 View  Result Date: 01/09/2018 CLINICAL DATA:  Hypoxia with altered mental status EXAM: PORTABLE CHEST 1 VIEW COMPARISON:  None. FINDINGS: Hyperinflation with emphysematous disease. No acute consolidation or effusion. Cardiomediastinal silhouette within normal limits. Aortic atherosclerosis. No pneumothorax. Old appearing right sixth and seventh rib deformities. IMPRESSION: No active disease.  Hyperinflation with emphysematous disease. Electronically Signed   By: Jasmine Pang M.D.   On: 01/09/2018 22:19     ASSESSMENT AND PLAN:   *  Overdose, undetermined intent IVC in place.  Sitter at  bedside. Patient is more awake.   Psychiatry consulted and waiting for input  *  COPD (chronic obstructive pulmonary disease) (HCC) -continue home inhaler Nebulizers as needed   * HTN (hypertension) -continue home meds  All the records are reviewed and case discussed with Care Management/Social Worker Management plans discussed with the patient, family and they are in agreement.  CODE STATUS: Full code  DVT Prophylaxis: SCDs  TOTAL TIME TAKING CARE OF THIS PATIENT: 35 minutes.   POSSIBLE D/C IN 1-2 DAYS, DEPENDING ON CLINICAL CONDITION.  Molinda BailiffSrikar R Gurtej Noyola M.D on 01/10/2018 at 3:28 PM  Between 7am to 6pm - Pager - [phone]  After 6pm go to [email or web address] - password [email or web address]  [facility] [place] [facility]  Office  [phone]  CC: Primary care physician; Inc[facility]  Note: This dictation was prepared with Dragon dictation along with smaller phrase technology. Any transcriptional errors that result from this process are unintentional.

## 2018-01-10 NOTE — Consult Note (Signed)
[place] [facility] Consult   Reason for Consult: Consult for 57 year old woman who came into the hospital with what appeared to be an overdose of unclear intent.   Referring Physician: Sudini Patient Identification: Erica Schmidt MRN:  [id number] Principal Diagnosis: Overdose, undetermined intent, initial encounter Diagnosis:   Patient Active Problem List   Diagnosis Date Noted  . Alcohol abuse [F10.10] 01/10/2018  . Benzodiazepine abuse ([place]) [F13.10] 01/10/2018  . Overdose, undetermined intent, initial encounter [T50.904A] 01/09/2018  . COPD (chronic obstructive pulmonary disease) ([place]) [J44.9] 01/09/2018  . HTN (hypertension) [I10] 01/09/2018    Total Time spent with patient: 1 hour  Subjective:   Erica Schmidt is a 57 y.o. female patient admitted with patient not able to give any information.  HPI: Patient seen chart reviewed.  Reviewed old notes from outside hospitals.  Reviewed labs.  Spoke with nursing.  Patient came into the hospital with altered mental status and what appeared to probably be an overdose of a combination of benzodiazepines also with a positive alcohol level.  Patient appears to have not been able to give much in the way of lucid history when she came in.  Commitment papers were filed because of the concern about suicidality.  When I came to see the patient this afternoon she was still on the regular medical floor with a Air cabin crew.  Sitter told me that the patient had woken up only slightly during the afternoon and was always confused and agitated when she did wake up.  I was not able to arouse the patient with verbal prompts and decided not to press the issue since she was probably still delirious.  I see that the patient has now been transferred to the intensive care unit just a short time ago because of her very high blood pressure.  Social history: Living situation seems a little unclear.  May be staying by herself.  No contact right now that  I have had with family.  It looks like the team has been in touch with a daughter.  Medical history: Currently with extremely high blood pressure.  Substance abuse history: Looking back in the old records it is pretty clear that the patient abuses alcohol and drugs.  She has had very similar presentations to [facility] not very long ago.  She is definitely not prescribed any benzodiazepines.  Past Psychiatric History: Patient has a history of overdoses and substance abuse.  Other diagnoses had been considered although it looks from the most recent notes like probably depression and substance abuse were the most likely things.  Last time she presented like this it does not appear that she had been intentionally trying to kill herself.  She has had previous hospitalizations at [facility] but appears to not be following up with any outpatient treatment  Risk to Self:   Risk to Others:   Prior Inpatient Therapy:   Prior Outpatient Therapy:    Past Medical History:  Past Medical History:  Diagnosis Date  . COPD (chronic obstructive pulmonary disease) ([place])   . Emphysema of lung ([place])   . Polysubstance abuse [facility])     Past Surgical History:  Procedure Laterality Date  . NO PAST SURGERIES     Family History:  Family History  Problem Relation Age of Onset  . Epilepsy Mother   . Emphysema Father   . Alcohol abuse Father    Family Psychiatric  History: Unknown Social History:  Social History   Substance and Sexual Activity  Alcohol Use  No     Social History   Substance and Sexual Activity  Drug Use Yes    Social History   Socioeconomic History  . Marital status: Married    Spouse name: Not on file  . Number of children: Not on file  . Years of education: Not on file  . Highest education level: Not on file  Occupational History  . Not on file  Social Needs  . Financial resource strain: Not on file  . Food insecurity:    Worry: Not on file    Inability: Not on file  . Transportation  needs:    Medical: Not on file    Non-medical: Not on file  Tobacco Use  . Smoking status: Current Every Day Smoker    Packs/day: 0.50    Types: Cigarettes  . Smokeless tobacco: Never Used  Substance and Sexual Activity  . Alcohol use: No  . Drug use: Yes  . Sexual activity: Not on file  Lifestyle  . Physical activity:    Days per week: Not on file    Minutes per session: Not on file  . Stress: Not on file  Relationships  . Social connections:    Talks on phone: Not on file    Gets together: Not on file    Attends religious service: Not on file    Active member of club or organization: Not on file    Attends meetings of clubs or organizations: Not on file    Relationship status: Not on file  Other Topics Concern  . Not on file  Social History Narrative  . Not on file   Additional Social History:    Allergies:  No Known Allergies  Labs:  Results for orders placed or performed during the hospital encounter of 01/09/18 (from the past 48 hour(s))  Comprehensive metabolic panel     Status: Abnormal   Collection Time: 01/09/18 10:12 PM  Result Value Ref Range   Sodium 144 135 - 145 mmol/L   Potassium 3.7 3.5 - 5.1 mmol/L   Chloride 104 98 - 111 mmol/L    Comment: Please note change in reference range.   CO2 29 22 - 32 mmol/L   Glucose, Bld 171 (H) 70 - 99 mg/dL    Comment: Please note change in reference range.   BUN 17 6 - 20 mg/dL    Comment: Please note change in reference range.   Creatinine, Ser 0.73 0.44 - 1.00 mg/dL   Calcium 8.6 (L) 8.9 - 10.3 mg/dL   Total Protein 8.3 (H) 6.5 - 8.1 g/dL   Albumin 3.8 3.5 - 5.0 g/dL   AST 28 15 - 41 U/L   ALT 17 0 - 44 U/L    Comment: Please note change in reference range.   Alkaline Phosphatase 51 38 - 126 U/L   Total Bilirubin 0.2 (L) 0.3 - 1.2 mg/dL   GFR calc non Af Amer >60 >60 mL/min   GFR calc Af Amer >60 >60 mL/min    Comment: (NOTE) The eGFR has been calculated using the CKD EPI equation. This calculation has  not been validated in all clinical situations. eGFR's persistently <60 mL/min signify possible Chronic Kidney Disease.    Anion gap 11 5 - 15    Comment: Performed at [facility], [address] [place], [place] [address]  Salicylate level     Status: None   Collection Time: 01/09/18 10:12 PM  Result Value Ref Range   Salicylate Lvl  19.9 2.8 - 30.0 mg/dL    Comment: Performed at [facility], [address] [place], [place] [address]  Acetaminophen level     Status: Abnormal   Collection Time: 01/09/18 10:12 PM  Result Value Ref Range   Acetaminophen (Tylenol), Serum <10 (L) 10 - 30 ug/mL    Comment: (NOTE) Therapeutic concentrations vary significantly. A range of 10-30 ug/mL  may be an effective concentration for many patients. However, some  are best treated at concentrations outside of this range. Acetaminophen concentrations >150 ug/mL at 4 hours after ingestion  and >50 ug/mL at 12 hours after ingestion are often associated with  toxic reactions. Performed at [facility], [address] [place], [place] [address]   Ethanol     Status: Abnormal   Collection Time: 01/09/18 10:12 PM  Result Value Ref Range   Alcohol, Ethyl (B) 60 (H) <10 mg/dL    Comment: (NOTE) Lowest detectable limit for serum alcohol is 10 mg/dL. For medical purposes only. Performed at [facility], [address] [place], [place] [address]   Urine Drug Screen, Qualitative     Status: Abnormal   Collection Time: 01/09/18 10:12 PM  Result Value Ref Range   Tricyclic, Ur Screen NONE DETECTED NONE DETECTED   Amphetamines, Ur Screen NONE DETECTED NONE DETECTED   MDMA (Ecstasy)Ur Screen NONE DETECTED NONE DETECTED   Cocaine Metabolite,Ur [place] NONE DETECTED NONE DETECTED   Opiate, Ur Screen NONE DETECTED NONE DETECTED   Phencyclidine (PCP) Ur S NONE DETECTED NONE DETECTED   Cannabinoid 50 Ng, Ur [place] NONE DETECTED NONE DETECTED   Barbiturates, Ur Screen (A) NONE  DETECTED    Result not available. Reagent lot number recalled by manufacturer.   Benzodiazepine, Ur Scrn POSITIVE (A) NONE DETECTED   Methadone Scn, Ur NONE DETECTED NONE DETECTED    Comment: (NOTE) Tricyclics + metabolites, urine    Cutoff 1000 ng/mL Amphetamines + metabolites, urine  Cutoff 1000 ng/mL MDMA (Ecstasy), urine              Cutoff 500 ng/mL Cocaine Metabolite, urine          Cutoff 300 ng/mL Opiate + metabolites, urine        Cutoff 300 ng/mL Phencyclidine (PCP), urine         Cutoff 25 ng/mL Cannabinoid, urine                 Cutoff 50 ng/mL Barbiturates + metabolites, urine  Cutoff 200 ng/mL Benzodiazepine, urine              Cutoff 200 ng/mL Methadone, urine                   Cutoff 300 ng/mL The urine drug screen provides only a preliminary, unconfirmed analytical test result and should not be used for non-medical purposes. Clinical consideration and professional judgment should be applied to any positive drug screen result due to possible interfering substances. A more specific alternate chemical method must be used in order to obtain a confirmed analytical result. Gas chromatography / mass spectrometry (GC/MS) is the preferred confirmat ory method. Performed at [facility], [address] [place], [place] [address]   CBC WITH DIFFERENTIAL     Status: Abnormal   Collection Time: 01/09/18 10:12 PM  Result Value Ref Range   WBC 8.7 3.6 - 11.0 K/uL   RBC 4.69 3.80 - 5.20 MIL/uL   Hemoglobin 14.7 12.0 - 16.0 g/dL   HCT 44.4  35.0 - 47.0 %   MCV 94.6 80.0 - 100.0 fL   MCH 31.4 26.0 - 34.0 pg   MCHC 33.2 32.0 - 36.0 g/dL   RDW 15.8 (H) 11.5 - 14.5 %   Platelets 273 150 - 440 K/uL   Neutrophils Relative % 75 %   Neutro Abs 6.5 1.4 - 6.5 K/uL   Lymphocytes Relative 17 %   Lymphs Abs 1.5 1.0 - 3.6 K/uL   Monocytes Relative 7 %   Monocytes Absolute 0.6 0.2 - 0.9 K/uL   Eosinophils Relative 0 %   Eosinophils Absolute 0.0 0 - 0.7 K/uL   Basophils  Relative 1 %   Basophils Absolute 0.1 0 - 0.1 K/uL    Comment: Performed at [facility], [address] [place], [place] [address]  Troponin I     Status: None   Collection Time: 01/09/18 10:12 PM  Result Value Ref Range   Troponin I <0.03 <0.03 ng/mL    Comment: Performed at [facility], [address] [place], [place] [address]  Urinalysis, Complete w Microscopic     Status: Abnormal   Collection Time: 01/09/18 10:26 PM  Result Value Ref Range   Color, Urine YELLOW (A) YELLOW   APPearance CLEAR (A) CLEAR   Specific Gravity, Urine 1.020 1.005 - 1.030   pH 5.0 5.0 - 8.0   Glucose, UA NEGATIVE NEGATIVE mg/dL   Hgb urine dipstick NEGATIVE NEGATIVE   Bilirubin Urine NEGATIVE NEGATIVE   Ketones, ur NEGATIVE NEGATIVE mg/dL   Protein, ur 100 (A) NEGATIVE mg/dL   Nitrite NEGATIVE NEGATIVE   Leukocytes, UA NEGATIVE NEGATIVE   RBC / HPF 0-5 0 - 5 RBC/hpf   WBC, UA 0-5 0 - 5 WBC/hpf   Bacteria, UA NONE SEEN NONE SEEN   Squamous Epithelial / LPF NONE SEEN 0 - 5   Mucus PRESENT    Hyaline Casts, UA PRESENT     Comment: Performed at [facility], [address], [place], [place] [address]  Basic metabolic panel     Status: Abnormal   Collection Time: 01/10/18  6:04 AM  Result Value Ref Range   Sodium 145 135 - 145 mmol/L   Potassium 4.4 3.5 - 5.1 mmol/L   Chloride 108 98 - 111 mmol/L    Comment: Please note change in reference range.   CO2 31 22 - 32 mmol/L   Glucose, Bld 124 (H) 70 - 99 mg/dL    Comment: Please note change in reference range.   BUN 15 6 - 20 mg/dL    Comment: Please note change in reference range.   Creatinine, Ser 0.37 (L) 0.44 - 1.00 mg/dL   Calcium 8.2 (L) 8.9 - 10.3 mg/dL   GFR calc non Af Amer >60 >60 mL/min   GFR calc Af Amer >60 >60 mL/min    Comment: (NOTE) The eGFR has been calculated using the CKD EPI equation. This calculation has not been validated in all clinical situations. eGFR's persistently <60 mL/min signify  possible Chronic Kidney Disease.    Anion gap 6 5 - 15    Comment: Performed at [facility], [address] [place], [place] [address]  CBC     Status: Abnormal   Collection Time: 01/10/18  6:04 AM  Result Value Ref Range   WBC 8.7 3.6 - 11.0 K/uL   RBC 4.36 3.80 - 5.20 MIL/uL   Hemoglobin 13.6 12.0 - 16.0 g/dL   HCT 42.0 35.0 - 47.0 %  MCV 96.4 80.0 - 100.0 fL   MCH 31.2 26.0 - 34.0 pg   MCHC 32.4 32.0 - 36.0 g/dL   RDW 15.6 (H) 11.5 - 14.5 %   Platelets 248 150 - 440 K/uL    Comment: Performed at [facility], [address] [place], [place] [address]  Salicylate level     Status: None   Collection Time: 01/10/18  6:04 AM  Result Value Ref Range   Salicylate Lvl 16.0 2.8 - 30.0 mg/dL    Comment: Performed at [facility], [address] [place], [place] [address]    Current Facility-Administered Medications  Medication Dose Route Frequency Provider Last Rate Last Dose  . acetaminophen (TYLENOL) tablet 650 mg  650 mg Oral Q6H PRN Lance Coon, MD       Or  . acetaminophen (TYLENOL) suppository 650 mg  650 mg Rectal Q6H PRN Lance Coon, MD      . albuterol (PROVENTIL) (2.5 MG/3ML) 0.083% nebulizer solution 2.5-5 mg  2.5-5 mg Inhalation Q6H PRN Lance Coon, MD      . enoxaparin (LOVENOX) injection 30 mg  30 mg Subcutaneous Q24H Lance Coon, MD   30 mg at 01/10/18 0916  . flumazenil (ROMAZICON) 0.5 MG/5ML injection           . naloxone (NARCAN) 0.4 MG/ML injection           . ondansetron (ZOFRAN) tablet 4 mg  4 mg Oral Q6H PRN Lance Coon, MD       Or  . ondansetron [facility]) injection 4 mg  4 mg Intravenous Q6H PRN Lance Coon, MD      . propofol (DIPRIVAN) 1000 MG/100ML infusion  5-80 mcg/kg/min Intravenous Titrated Hermelinda Dellen, DO        Musculoskeletal: Strength & Muscle Tone: decreased Gait & Station: unable to stand Patient leans: N/A  Psychiatric Specialty Exam: Physical Exam  Nursing note and vitals  reviewed. Constitutional: She appears well-developed.  HENT:  Head: Normocephalic and atraumatic.  Eyes: Pupils are equal, round, and reactive to light. Conjunctivae are normal.  Neck: Normal range of motion.  Cardiovascular: Regular rhythm and normal heart sounds.  Respiratory: Effort normal.  GI: Soft.  Musculoskeletal: Normal range of motion.  Skin: Skin is warm and dry.  Psychiatric: Her affect is blunt.    Review of Systems  Unable to perform ROS: Patient unresponsive    Blood pressure (!) 172/121, pulse 94, temperature 97.6 F (36.4 C), temperature source Axillary, resp. rate 18, height _0  (1.575 m), weight 42.8 kg (94 lb 5.7 oz), SpO2 99 %.Body mass index is 17.26 kg/m.  General Appearance: Disheveled  Eye Contact:  None  Speech:  Negative  Volume:  Decreased  Mood:  Negative  Affect:  Negative  Thought Process:  NA  Orientation:  Negative  Thought Content:  Negative  Suicidal Thoughts:  No  Homicidal Thoughts:  No  Memory:  Immediate;   Negative Recent;   Negative Remote;   Negative  Judgement:  Impaired  Insight:  Lacking  Psychomotor Activity:  Decreased  Concentration:  Concentration: Poor  Recall:  Negative  Fund of Knowledge:  Negative  Language:  Negative  Akathisia:  Negative  Handed:  Right  AIMS (if indicated):     Assets:  Housing  ADL's:  Impaired  Cognition:  Impaired,  Severe  Sleep:        Treatment Plan Summary: Plan 57 year old woman came into the hospital with an overdose of at least benzodiazepines  and alcohol undetermined intent although she does have a history of substance abuse and is it is possible that there was no actual suicidality.  When I saw the patient earlier she was still very withdrawn and sedated although confused.  It seems like in the short time since then she became agitated and her blood pressure is extremely high so now she is in the intensive care unit.  Patient needs to be managed for alcohol and benzodiazepine  withdrawal.  No specific psychiatric medicines as far as anything for mood or psychosis required.  I am going to stay out of this while she is managed in the ICU for the moment.  We will continue to follow as needed.  I would recommend continuing the sitter until we can get a real interview from her.  Disposition: See note above  Alethia Berthold, MD 01/10/2018 6:08 PM

## 2018-01-10 NOTE — Progress Notes (Signed)
Patient ID: Paulino RilyDeborah L Schmidt, female   DOB: 03/09/1961, 57 y.o.   MRN: [id number] Pulmonary/critical care attending  Procedure note Central line placement Indication: Patient hypotensive, unresponsive, requiring pressors Right femoral approach chosen as patient is still moving had around Performed under ultrasound guidance Complete contact barrier precautions utilized Under direct visualization the right femoral vein was cannulated first pass Guidewire was placed and needle was removed Using the Seldinger technique a triple-lumen catheter was placed without difficulty The guidewire was removed intact All 3 ports had dark nonpulsatile venous return All ports were flushed Sutured into place Dressing applied by nurse Patient tolerated procedure well  Tora KindredJohn Kynadi Dragos, D.O.

## 2018-01-10 NOTE — ED Notes (Signed)
[facility].

## 2018-01-10 NOTE — Progress Notes (Signed)
Spoke with patient's daughter, Thereasa Distancember Hirata, to notify her of change in patient status and transfer to ICU.  Understanding was verbalized and all questions were answered.

## 2018-01-10 NOTE — ED Notes (Signed)
Pt was on 4L of oxygen, pt trialed on 2L however saturation dropped to 88%. Pt states hx of COPD and emphysema and current daily smoker. Pt placed on 3L and is currently at 94%.

## 2018-01-10 NOTE — Progress Notes (Signed)
Pharmacy Antibiotic Note  Erica RilyDeborah L Schmidt is a 57 y.o. female admitted on 01/09/2018 with aspiration pneumonia.  Pharmacy has been consulted for vancomycin and Unasyn dosing.  Plan: Unasyn 3 g IV q6h  Vancomycin 750 mg IV x1 then 500 mg IV q18h with 12h stacked dosing Trough before 4th dose Goal trough 15-20 mcg/ml   Height: 5\' 2"  (157.5 cm) Weight: 94 lb 5.7 oz (42.8 kg) IBW/kg (Calculated) : 50.1  Temp (24hrs), Avg:97.5 F (36.4 C), Min:96.1 F (35.6 C), Max:98.5 F (36.9 C)  Recent Labs  Lab 01/09/18 2212 01/10/18 0604  WBC 8.7 8.7  CREATININE 0.73 0.37*    Estimated Creatinine Clearance: 51.8 mL/min (A) (by C-G formula based on SCr of 0.37 mg/dL (L)).    No Known Allergies  Antimicrobials this admission: Vanc/Unasyn 7/2>>  Dose adjustments this admission:   Microbiology results: 7/2 trach aspirate sent 7/2 MRSA PCR: ordered  Thank you for allowing pharmacy to be a part of this patient's care.  Marty HeckWang, Zara Wendt L 01/10/2018 6:37 PM

## 2018-01-10 NOTE — Progress Notes (Signed)
Safety sitter called Primary RN approximately 17:30 that pt. is sweaty and BP was elevated. Primary RN walked int he room and  unable to rouse pt. So rapid response was called. MD notified. Pt. Was transferred to ICU.

## 2018-01-10 NOTE — Progress Notes (Signed)
Chaplain received PG for 225. Chaplain arrived and care team was working with PT. The page did not say it was a code or RR. So, when Chaplain arrived she was for sure until she seen the care team. Chaplain stood inside PT room, to make her presence known and Care Team was working on PT. Care Team was able to get a response from PT so they move her to ICU. Chaplain ask if she was needed please page her, nurse said ok, thank you.

## 2018-01-10 NOTE — Progress Notes (Signed)
Pt. belongings sent to ICU with Pt.

## 2018-01-11 ENCOUNTER — Inpatient Hospital Stay: Payer: Medicaid Other

## 2018-01-11 DIAGNOSIS — E43 Unspecified severe protein-calorie malnutrition: Secondary | ICD-10-CM

## 2018-01-11 LAB — GLUCOSE, CAPILLARY
GLUCOSE-CAPILLARY: 132 mg/dL — AB (ref 70–99)
Glucose-Capillary: 111 mg/dL — ABNORMAL HIGH (ref 70–99)
Glucose-Capillary: 111 mg/dL — ABNORMAL HIGH (ref 70–99)
Glucose-Capillary: 120 mg/dL — ABNORMAL HIGH (ref 70–99)
Glucose-Capillary: 126 mg/dL — ABNORMAL HIGH (ref 70–99)
Glucose-Capillary: 131 mg/dL — ABNORMAL HIGH (ref 70–99)
Glucose-Capillary: 135 mg/dL — ABNORMAL HIGH (ref 70–99)

## 2018-01-11 LAB — COMPREHENSIVE METABOLIC PANEL
ALK PHOS: 42 U/L (ref 38–126)
ALT: 26 U/L (ref 0–44)
ANION GAP: 5 (ref 5–15)
AST: 43 U/L — ABNORMAL HIGH (ref 15–41)
Albumin: 2.6 g/dL — ABNORMAL LOW (ref 3.5–5.0)
BUN: 15 mg/dL (ref 6–20)
CALCIUM: 7.8 mg/dL — AB (ref 8.9–10.3)
CO2: 25 mmol/L (ref 22–32)
Chloride: 112 mmol/L — ABNORMAL HIGH (ref 98–111)
Creatinine, Ser: 0.46 mg/dL (ref 0.44–1.00)
GFR calc non Af Amer: >60 mL/min (ref >60)
Glucose, Bld: 159 mg/dL — ABNORMAL HIGH (ref 70–99)
Potassium: 4 mmol/L (ref 3.5–5.1)
SODIUM: 142 mmol/L (ref 135–145)
Total Bilirubin: 0.6 mg/dL (ref 0.3–1.2)
Total Protein: 5.5 g/dL — ABNORMAL LOW (ref 6.5–8.1)

## 2018-01-11 LAB — HIV ANTIBODY (ROUTINE TESTING W REFLEX): HIV Screen 4th Generation wRfx: NONREACTIVE

## 2018-01-11 LAB — CBC WITH DIFFERENTIAL/PLATELET
BASOS PCT: 0 %
Basophils Absolute: 0 10*3/uL (ref 0–0.1)
Eosinophils Absolute: 0 10*3/uL (ref 0–0.7)
Eosinophils Relative: 0 %
HEMATOCRIT: 38.1 % (ref 35.0–47.0)
HEMOGLOBIN: 12.1 g/dL (ref 12.0–16.0)
Lymphocytes Relative: 3 %
Lymphs Abs: 0.9 10*3/uL — ABNORMAL LOW (ref 1.0–3.6)
MCH: 30.7 pg (ref 26.0–34.0)
MCHC: 31.7 g/dL — ABNORMAL LOW (ref 32.0–36.0)
MCV: 96.9 fL (ref 80.0–100.0)
MONOS PCT: 5 %
Monocytes Absolute: 1.3 10*3/uL — ABNORMAL HIGH (ref 0.2–0.9)
NEUTROS ABS: 24.2 10*3/uL — AB (ref 1.4–6.5)
NEUTROS PCT: 92 %
Platelets: 251 10*3/uL (ref 150–440)
RBC: 3.93 MIL/uL (ref 3.80–5.20)
RDW: 15.6 % — ABNORMAL HIGH (ref 11.5–14.5)
WBC: 26.4 10*3/uL — AB (ref 3.6–11.0)

## 2018-01-11 LAB — BLOOD GAS, ARTERIAL
ACID-BASE DEFICIT: 1.6 mmol/L (ref 0.0–2.0)
ACID-BASE DEFICIT: 2.1 mmol/L — AB (ref 0.0–2.0)
Bicarbonate: 26.2 mmol/L (ref 20.0–28.0)
Bicarbonate: 26 mmol/L (ref 20.0–28.0)
FIO2: 0.5
FIO2: 0.5
Mechanical Rate: 25
Mechanical Rate: 25
O2 SAT: 97.8 %
O2 SAT: 98 %
PATIENT TEMPERATURE: 37
PCO2 ART: 57 mmHg — AB (ref 32.0–48.0)
PCO2 ART: 58 mmHg — AB (ref 32.0–48.0)
PEEP: 5 cmH2O
PEEP: 5 cmH2O
PH ART: 7.27 — AB (ref 7.350–7.450)
PH ART: 7.26 — AB (ref 7.350–7.450)
PO2 ART: 118 mmHg — AB (ref 83.0–108.0)
Patient temperature: 37
VT: 400 mL
VT: 400 mL
pO2, Arterial: 114 mmHg — ABNORMAL HIGH (ref 83.0–108.0)

## 2018-01-11 LAB — PHOSPHORUS
Phosphorus: 2.5 mg/dL (ref 2.5–4.6)
Phosphorus: 2.2 mg/dL — ABNORMAL LOW (ref 2.5–4.6)

## 2018-01-11 LAB — MAGNESIUM
Magnesium: 2.7 mg/dL — ABNORMAL HIGH (ref 1.7–2.4)
Magnesium: 2 mg/dL (ref 1.7–2.4)

## 2018-01-11 MED ORDER — ADULT MULTIVITAMIN LIQUID CH
15.0000 mL | Freq: Every day | ORAL | Status: DC
  Administered 2018-01-12 – 2018-01-15 (×3): 15 mL
  Filled 2018-01-11 (×4): qty 15

## 2018-01-11 MED ORDER — FAMOTIDINE 40 MG/5ML PO SUSR
20.0000 mg | Freq: Every day | ORAL | Status: DC
  Administered 2018-01-11 – 2018-01-12 (×2): 20 mg
  Filled 2018-01-11 (×3): qty 2.5

## 2018-01-11 MED ORDER — FREE WATER
100.0000 mL | Freq: Four times a day (QID) | Status: DC
  Administered 2018-01-11 – 2018-01-13 (×9): 100 mL

## 2018-01-11 NOTE — Progress Notes (Addendum)
Nutrition Follow-up  DOCUMENTATION CODES:   Severe malnutrition in context of chronic illness  INTERVENTION:  Continue Vital High Protein at 40 mL/hr (960 mL goal daily volume) via OGT. Provides 960 kcal, 84 grams of protein, 860 mL H2O daily. With current propofol rate provides 1163 kcal daily.  Will discontinue Pro-Stat.  Provide free water flush of 100 mL Q6hrs. Provides a total of 1260 mL H2O daily including water in tube feeding.  Will change MVI to liquid MVI daily per tube. Continue folic acid 1 mg daily per tube and thiamine 100 mg daily per tube.  Consider addition of bowel regimen.  NUTRITION DIAGNOSIS:   Severe Malnutrition related to chronic illness, social / environmental circumstances(COPD, polysubstance abuse ) as evidenced by severe fat depletion, severe muscle depletion.  Ongoing - now addressing with TF regimen.  GOAL:   Patient will meet greater than or equal to 90% of their needs  Met with TF regimen.  MONITOR:   Diet advancement, Labs, Weight trends, Skin, I & O's  REASON FOR ASSESSMENT:   Other (Comment), Ventilator, Consult(Low BMI) Enteral/tube feeding initiation and management  ASSESSMENT:   57 y/o female with h/o substance abuse, COPD, schizophrenia admitted with overdose    -Rapid response was called on 7/2 as patient became apneic, unresponsive, and was unable to protect airway. She was emergently intubated. Excessive secretions were noted from ETT.  Patient intubated and sedated. No family members present at time of RD assessment. Currently on PRVC mode with FiO2 40%. Abdomen soft today. Banana bag is running.  Access: OGT placed 7/2; terminates in stomach per abdominal x-ray on 7/2; 62 cm at corner of mouth  MAP: 65-86 mmHg  TF: pt was initiated on adult TF protocol last night; started on Vital High Protein at 40 mL/hr + Pro-Stat 30 mL BID and is tolerating well  Patient is currently intubated on ventilator support Ve: 10.1  L/min Temp (24hrs), Avg:97.3 F (36.3 C), Min:94.7 F (34.8 C), Max:98.3 F (36.8 C)  Propofol: 7.7 ml/hr (203 kcal daily)  Medications reviewed and include: folic acid 1 mg daily, Novolog 0-9 units Q4hrs, MVI daily, thiamine 100 mg daily, Unasyn, Levophed gtt at 8 micrograms/min, propofol gtt.  Labs reviewed: CBG 115-172 past 24 hrs, Chloride 112, Magnesium 2.7. Potassium and Phosphorus WNL.  I/O: 1100 mL UOP yesterday (1 mL/kg/hr)  Discussed with RN and on rounds.  Diet Order:   Diet Order           Diet NPO time specified  Diet effective now          EDUCATION NEEDS:   Not appropriate for education at this time  Skin:  Skin Assessment: Reviewed RN Assessment  Last BM:  Unknown/PTA  Height:   Ht Readings from Last 1 Encounters:  01/10/18 5' 2"  (1.575 m)    Weight:   Wt Readings from Last 1 Encounters:  01/11/18 100 lb 1.4 oz (45.4 kg)    Ideal Body Weight:  50 kg  BMI:  Body mass index is 18.31 kg/m.  Estimated Nutritional Needs:   Kcal:  0301-3143 (25-30 kcal/kg)  Protein:  73-85 grams (1.8-2 grams/kg)  Fluid:  1.3-1.5 L/day (30-35 mL/kg)  Willey Blade, MS, RD, LDN Office: [phone] Pager: [phone] After Hours/Weekend Pager: [phone]

## 2018-01-11 NOTE — Progress Notes (Signed)
   01/11/18 1100  Clinical Encounter Type  Visited With Patient  Visit Type Follow-up  Spiritual Encounters  Spiritual Needs Prayer   Chaplain offered silent and energetic prayer for patient and care team.

## 2018-01-11 NOTE — Progress Notes (Signed)
[employer] - [place] at [facility]   PATIENT NAME: Erica Schmidt    MR#:  [id number]  DATE OF BIRTH:  [birth year]  SUBJECTIVE:  CHIEF COMPLAINT:   Chief Complaint  Patient presents with  . Drug Overdose   Patient admitted for overdose on benzodiazepines and alcohol intoxication.  Had IVC placed with sitter at bedside.  Rapid response called on 01/10/2018 due to unresponsiveness and seemed to have aspirated.  Intubated and transferred to ICU.  Started on levo fed and propofol. On full vent support and sedated.  REVIEW OF SYSTEMS:    Review of Systems  Unable to perform ROS: Intubated    DRUG ALLERGIES:  No Known Allergies  VITALS:  Blood pressure (!) 82/57, pulse 71, temperature 97.9 F (36.6 C), temperature source Axillary, resp. rate (!) 25, height 5\' 2"  (1.575 m), weight 45.4 kg (100 lb 1.4 oz), SpO2 100 %.  PHYSICAL EXAMINATION:   Physical Exam  GENERAL:  57 y.o.-year-old patient lying in the bed  EYES: Pupils equal, round, reactive to light and accommodation. No scleral icterus. Extraocular muscles intact.  HEENT: Head atraumatic, normocephalic. Oropharynx and nasopharynx clear.  ET tube NECK:  Supple, no jugular venous distention. No thyroid enlargement, no tenderness.  LUNGS: Bilateral coarse breath sounds CARDIOVASCULAR: S1, S2 normal. No murmurs, rubs, or gallops.  ABDOMEN: Soft, nontender, nondistended. Bowel sounds present. No organomegaly or mass.  EXTREMITIES: No cyanosis, clubbing or edema b/l.    PSYCHIATRIC: The patient is sedated SKIN: No obvious rash, lesion, or ulcer.   LABORATORY PANEL:   CBC Recent Labs  Lab 01/11/18 0354  WBC 26.4*  HGB 12.1  HCT 38.1  PLT 251   ------------------------------------------------------------------------------------------------------------------ Chemistries  Recent Labs  Lab 01/11/18 0354  NA 142  K 4.0  CL 112*  CO2 25  GLUCOSE 159*  BUN 15  CREATININE 0.46  CALCIUM 7.8*  MG 2.7*   AST 43*  ALT 26  ALKPHOS 42  BILITOT 0.6   ------------------------------------------------------------------------------------------------------------------  Cardiac Enzymes Recent Labs  Lab 01/10/18 1918  TROPONINI <0.03   ------------------------------------------------------------------------------------------------------------------  RADIOLOGY:  Dg Abd 1 View  Result Date: 01/10/2018 CLINICAL DATA:  Nasogastric tube placement EXAM: ABDOMEN - 1 VIEW COMPARISON:  None. FINDINGS: Nasogastric tube tip and side port are in the stomach. There is no bowel dilatation or air-fluid level to suggest bowel obstruction. No free air. Lung bases are clear. Lungs appear hyperexpanded. IMPRESSION: Nasogastric tube tip and side port in stomach. No bowel obstruction or free air evident. Electronically Signed   By: Bretta Bang III M.D.   On: 01/10/2018 20:10   Ct Head Wo Contrast  Result Date: 01/09/2018 CLINICAL DATA:  57 year old female with altered mental status. EXAM: CT HEAD WITHOUT CONTRAST TECHNIQUE: Contiguous axial images were obtained from the base of the skull through the vertex without intravenous contrast. COMPARISON:  None. FINDINGS: Brain: The ventricles and sulci appropriate size for patient's age. Mild periventricular and deep white matter chronic microvascular ischemic changes noted. There is no acute intracranial hemorrhage. No mass effect or midline shift. No extra-axial fluid collection. Vascular: No hyperdense vessel or unexpected calcification. Skull: Normal. Negative for fracture or focal lesion. Sinuses/Orbits: No acute finding. Other: None IMPRESSION: 1. No acute intracranial pathology. 2. Mild chronic microvascular ischemic changes. Electronically Signed   By: Elgie Collard M.D.   On: 01/09/2018 22:31   Dg Chest Port 1 View  Result Date: 01/11/2018 CLINICAL DATA:  Intubation.  COPD EXAM: PORTABLE CHEST 1 VIEW COMPARISON:  Yesterday FINDINGS: Endotracheal tube tip between  the clavicular heads and carina. An orogastric tube reaches the stomach. Hyperinflation and interstitial coarsening. There is no consolidation, effusion, or pneumothorax. Artifact from EKG leads and other hardware. Remote and nonunited right sixth and seventh rib fractures. IMPRESSION: 1. COPD.  Symmetric interstitial opacities is likely bronchitic. 2. Stable hardware positioning. Electronically Signed   By: Marnee SpringJonathon  Watts M.D.   On: 01/11/2018 09:52   Dg Chest Port 1 View  Result Date: 01/10/2018 CLINICAL DATA:  Tube placement, cardiac arrest EXAM: PORTABLE CHEST 1 VIEW COMPARISON:  Portable exam 1826 hours compared to 01/09/2018 FINDINGS: Tip of endotracheal tube projects 3.8 cm above carina. Nasogastric tube extends to at least the distal esophagus. Lung bases excluded. Normal heart size, mediastinal contours, and pulmonary vascularity. Emphysematous changes with mild peribronchial thickening and probable mild perihilar edema. No gross pleural effusion or pneumothorax. Bones demineralized with old fractures of the posterior RIGHT sixth and seventh ribs. IMPRESSION: COPD changes with BILATERAL perihilar infiltrates likely representing pulmonary edema. Electronically Signed   By: Ulyses SouthwardMark  Boles M.D.   On: 01/10/2018 19:00   Dg Chest Port 1 View  Result Date: 01/09/2018 CLINICAL DATA:  Hypoxia with altered mental status EXAM: PORTABLE CHEST 1 VIEW COMPARISON:  None. FINDINGS: Hyperinflation with emphysematous disease. No acute consolidation or effusion. Cardiomediastinal silhouette within normal limits. Aortic atherosclerosis. No pneumothorax. Old appearing right sixth and seventh rib deformities. IMPRESSION: No active disease.  Hyperinflation with emphysematous disease. Electronically Signed   By: Jasmine PangKim  Fujinaga M.D.   On: 01/09/2018 22:19     ASSESSMENT AND PLAN:   *Aspiration pneumonia with acute hypoxic respiratory failure and septic shock.   On broad-spectrum antibiotics. WBC still high.  Cultures  pending. Continue full vent support.  Nebulizers. Appreciate pulmonary following patient in ICU.  *Polysubstance abuse.  Psychiatry to decide regarding admission to behavioral health unit once patient is extubated and awake.  *Hypertension.  Medications on hold due to septic shock  All the records are reviewed and case discussed with Care Management/Social Worker Management plans discussed with the patient, family and they are in agreement.  CODE STATUS: FULL CODE  DVT Prophylaxis: SCDs  TOTAL TIME TAKING CARE OF THIS PATIENT: 35 minutes.   POSSIBLE D/C IN 3-4 DAYS, DEPENDING ON CLINICAL CONDITION.  Molinda BailiffSrikar R Ernesto Zukowski M.D on 01/11/2018 at 11:55 AM  Between 7am to 6pm - Pager - [phone]  After 6pm go to [email or web address] - password [email or web address]  [facility]  Office  [phone]  CC: Primary care physician; Inc[facility]  Note: This dictation was prepared with Dragon dictation along with smaller phrase technology. Any transcriptional errors that result from this process are unintentional.

## 2018-01-11 NOTE — Progress Notes (Signed)
Follow up - Critical Care Medicine Note  Patient Details:    Erica Schmidt is an 57 y.o. female. With a past medical history remarkable for COPD/emphysema, polysubstance abuse, recent stressors, found slumped over yesterday, was subsequently brought to the emergency department, questionable concern for suicidal ideation,urine drug screen was positive for benzodiazepine, alcohol was 60, salicylates was 19.9, was admitted to the floor, rapid response was called this afternoon as patient became apneic, unresponsive, unable to protect airway, was given Narcan and flumazenil,subsequently intubated by respiratory therapy on the floor and brought to the intensive care unit. Excessive secretions were noted from endotracheal tube.    Lines, Airways, Drains: Airway 8 mm (Active)  Secured at (cm) 23 cm 01/11/2018  4:00 AM  Measured From Lips 01/11/2018  4:00 AM  Secured Location Left 01/11/2018  4:00 AM  Secured By Erica Schmidt 01/11/2018  4:00 AM  Tube Holder Repositioned Yes 01/11/2018  3:57 AM  Cuff Pressure (cm H2O) 26 cm H2O 01/11/2018  3:57 AM  Site Condition Dry 01/11/2018  4:00 AM     CVC Triple Lumen 01/10/18 Right Femoral (Active)  Indication for Insertion or Continuance of Line Vasoactive infusions 01/10/2018  8:00 PM  Site Assessment Intact;Bleeding 01/10/2018  8:00 PM  Proximal Lumen Status Flushed;Saline locked;Blood return noted 01/10/2018  8:00 PM  Medial Lumen Status Flushed;Saline locked;Blood return noted 01/10/2018  8:00 PM  Distal Lumen Status Flushed;Saline locked;Blood return noted 01/10/2018  8:00 PM  Dressing Type Transparent;Occlusive 01/10/2018  8:00 PM  Dressing Status Intact;Antimicrobial disc in place;New drainage 01/10/2018  8:00 PM  Line Care Connections checked and tightened 01/10/2018  8:00 PM  Dressing Change Due 01/17/18 01/10/2018  8:00 PM     NG/OG Tube Orogastric Xray Documented cm marking at nare/ corner of mouth 62 cm (Active)  Cm Marking at Nare/Corner of Mouth (if  applicable) 62 cm 01/11/2018  4:00 AM  Site Assessment Clean;Dry;Intact 01/11/2018  4:00 AM  Ongoing Placement Verification No change in cm markings or external length of tube from initial placement;No change in respiratory status;No acute changes, not attributed to clinical condition;Xray 01/11/2018  4:00 AM  Status Infusing tube feed 01/11/2018  4:00 AM  Drainage Appearance None 01/11/2018  4:00 AM     Urethral Catheter Erica Mccreedy, RN 16 Fr. (Active)  Indication for Insertion or Continuance of Catheter Unstable critical patients (first 24-48 hours) 01/11/2018  4:00 AM  Site Assessment Clean;Intact;Dry 01/11/2018  4:00 AM  Catheter Maintenance Bag below level of bladder;Catheter secured;Drainage bag/tubing not touching floor;Insertion date on drainage bag;No dependent loops;Seal intact 01/11/2018  4:00 AM  Collection Container Standard drainage bag 01/11/2018  4:00 AM  Securement Method Securing device (Describe) 01/11/2018  4:00 AM  Urinary Catheter Interventions Unclamped 01/11/2018  4:00 AM  Output (mL) 70 mL 01/11/2018  6:00 AM    Anti-infectives:  Anti-infectives (From admission, onward)   Start     Dose/Rate Route Frequency Ordered Stop   01/11/18 0800  vancomycin (VANCOCIN) 500 mg in sodium chloride 0.9 % 100 mL IVPB     500 mg 100 mL/hr over 60 Minutes Intravenous Every 18 hours 01/10/18 1915     01/10/18 2000  Ampicillin-Sulbactam (UNASYN) 3 g in sodium chloride 0.9 % 100 mL IVPB     3 g 200 mL/hr over 30 Minutes Intravenous Every 6 hours 01/10/18 1841     01/10/18 1915  vancomycin (VANCOCIN) IVPB 750 mg/150 ml premix     750 mg 150 mL/hr over 60 Minutes Intravenous  Once 01/10/18 1910 01/10/18 2043      Microbiology: Results for orders placed or performed during the hospital encounter of 01/09/18  Culture, respiratory (NON-Expectorated)     Status: None (Preliminary result)   Collection Time: 01/10/18  6:30 PM  Result Value Ref Range Status   Specimen Description TRACHEAL ASPIRATE  Final    Special Requests   Final    NONE Performed at [facility], [address], [place], [place] [address]    Gram Stain   Final    ABUNDANT WBC PRESENT, PREDOMINANTLY PMN FEW GRAM POSITIVE COCCI FEW GRAM POSITIVE RODS    Culture PENDING  Incomplete   Report Status PENDING  Incomplete  MRSA PCR Screening     Status: None   Collection Time: 01/10/18  7:44 PM  Result Value Ref Range Status   MRSA by PCR NEGATIVE NEGATIVE Final    Comment:        The GeneXpert MRSA Assay (FDA approved for NASAL specimens only), is one component of a comprehensive MRSA colonization surveillance program. It is not intended to diagnose MRSA infection nor to guide or monitor treatment for MRSA infections. Performed at [facility], [address] [place], [place] [address]     Studies: Dg Abd 1 View  Result Date: 01/10/2018 CLINICAL DATA:  Nasogastric tube placement EXAM: ABDOMEN - 1 VIEW COMPARISON:  None. FINDINGS: Nasogastric tube tip and side port are in the stomach. There is no bowel dilatation or air-fluid level to suggest bowel obstruction. No free air. Lung bases are clear. Lungs appear hyperexpanded. IMPRESSION: Nasogastric tube tip and side port in stomach. No bowel obstruction or free air evident. Electronically Signed   By: Bretta Bang III M.D.   On: 01/10/2018 20:10   Ct Head Wo Contrast  Result Date: 01/09/2018 CLINICAL DATA:  57 year old female with altered mental status. EXAM: CT HEAD WITHOUT CONTRAST TECHNIQUE: Contiguous axial images were obtained from the base of the skull through the vertex without intravenous contrast. COMPARISON:  None. FINDINGS: Brain: The ventricles and sulci appropriate size for patient's age. Mild periventricular and deep white matter chronic microvascular ischemic changes noted. There is no acute intracranial hemorrhage. No mass effect or midline shift. No extra-axial fluid collection. Vascular: No hyperdense vessel or unexpected  calcification. Skull: Normal. Negative for fracture or focal lesion. Sinuses/Orbits: No acute finding. Other: None IMPRESSION: 1. No acute intracranial pathology. 2. Mild chronic microvascular ischemic changes. Electronically Signed   By: Elgie Collard M.D.   On: 01/09/2018 22:31   Dg Chest Port 1 View  Result Date: 01/10/2018 CLINICAL DATA:  Tube placement, cardiac arrest EXAM: PORTABLE CHEST 1 VIEW COMPARISON:  Portable exam 1826 hours compared to 01/09/2018 FINDINGS: Tip of endotracheal tube projects 3.8 cm above carina. Nasogastric tube extends to at least the distal esophagus. Lung bases excluded. Normal heart size, mediastinal contours, and pulmonary vascularity. Emphysematous changes with mild peribronchial thickening and probable mild perihilar edema. No gross pleural effusion or pneumothorax. Bones demineralized with old fractures of the posterior RIGHT sixth and seventh ribs. IMPRESSION: COPD changes with BILATERAL perihilar infiltrates likely representing pulmonary edema. Electronically Signed   By: Ulyses Southward M.D.   On: 01/10/2018 19:00   Dg Chest Port 1 View  Result Date: 01/09/2018 CLINICAL DATA:  Hypoxia with altered mental status EXAM: PORTABLE CHEST 1 VIEW COMPARISON:  None. FINDINGS: Hyperinflation with emphysematous disease. No acute consolidation or effusion. Cardiomediastinal silhouette within normal limits. Aortic atherosclerosis. No pneumothorax. Old appearing right sixth  and seventh rib deformities. IMPRESSION: No active disease.  Hyperinflation with emphysematous disease. Electronically Signed   By: Jasmine PangKim  Fujinaga M.D.   On: 01/09/2018 22:19    Consults: Treatment Team:  Erica Schmidt, Erica Schmidt, Erica Schmidt, Erica DenmarkJohn Schmidt, Erica   Subjective:    Overnight Issues: Patient is status post intubation, bronchoscopy, central line placement, presently on 8 mcgs of norepinephrine.  Objective:  Vital signs for last 24 hours: Temp:  [94.7 F (34.8 C)-98.3 F (36.8 C)] 98.3 F (36.8 C)  (07/03 0400) Pulse Rate:  [70-102] 73 (07/03 0600) Resp:  [16-25] 25 (07/03 0600) BP: (82-201)/(60-197) 96/70 (07/03 0600) SpO2:  [84 %-100 %] 99 % (07/03 0600) FiO2 (%):  [50 %-70 %] 50 % (07/03 0357) Weight:  [100 lb 1.4 oz (45.4 kg)] 100 lb 1.4 oz (45.4 kg) (07/03 0444)  Hemodynamic parameters for last 24 hours:    Intake/Output from previous day: 07/02 0701 - 07/03 0700 In: 938.2 [I.V.:366.8; NG/GT:371.3; IV Piggyback:200] Out: 1100 [Urine:1100]  Intake/Output this shift: No intake/output data recorded.  Vent settings for last 24 hours: Vent Mode: PRVC FiO2 (%):  [50 %-70 %] 50 % Set Rate:  [20 bmp-25 bmp] 25 bmp Vt Set:  [400 mL] 400 mL PEEP:  [5 cmH20] 5 cmH20 Plateau Pressure:  [20 cmH20] 20 cmH20  Physical Exam:   Vital signs:       Please see the above listed vital signs HEENT:           Patient is edentulous, endotracheal tube in place, OGT, no distended neck veins, trachea midline Cardiovascular:           Regular rhythm Pulmonary:      Prolonged expiratory phase Abdominal:      Positive bowel sounds, soft exam Extremities:     No clubbing cyanosis or edema noted Neurologic:      Patient is somnolent but starting to wake up Cutaneous:      No rashes or lesions noted    Assessment/Plan:   Respiratory failure. Patient's urine drug screen was noted, positive for benzodiazepine and alcohol, found unresponsive on the floor, most likely aspiration as the patient's endotracheal tube is full of secretions. On mechanical ventilation protocol, started on vancomycin and Unasyn, pending respiratory culture data on albuterol, Atrovent and budesonide  COPD. On mechanical ventilation and bronchodilators  Septic shock. On Unasyn and vancomycin, etiology most likely aspiration event. Will continue to wean norepinephrine as tolerated, will check random cortisol level  Suicidal ideation. Will need psychiatry follow-up post extubation  On CIWA protocol for possible  benzodiazepine and alcohol withdrawal  Leukocytosis. Ending respiratory culture data   Tora KindredJohn Jailon Schaible, DO  Critical Care Total Time 40 minutes  Scheryl Sanborn 01/11/2018  *Care during the described time interval was provided by me and/or other providers on the critical care team.  I have reviewed this patient's available data, including medical history, events of note, physical examination and test results as part of my evaluation.

## 2018-01-12 ENCOUNTER — Inpatient Hospital Stay: Payer: Medicaid Other

## 2018-01-12 LAB — GLUCOSE, CAPILLARY
Glucose-Capillary: 103 mg/dL — ABNORMAL HIGH (ref 70–99)
Glucose-Capillary: 120 mg/dL — ABNORMAL HIGH (ref 70–99)
Glucose-Capillary: 128 mg/dL — ABNORMAL HIGH (ref 70–99)
Glucose-Capillary: 91 mg/dL (ref 70–99)
Glucose-Capillary: 96 mg/dL (ref 70–99)
Glucose-Capillary: 96 mg/dL (ref 70–99)

## 2018-01-12 LAB — BLOOD GAS, ARTERIAL
ACID-BASE EXCESS: 5.3 mmol/L — AB (ref 0.0–2.0)
ACID-BASE EXCESS: 4.4 mmol/L — AB (ref 0.0–2.0)
BICARBONATE: 33 mmol/L — AB (ref 20.0–28.0)
Bicarbonate: 32.5 mmol/L — ABNORMAL HIGH (ref 20.0–28.0)
FIO2: 0.35
FIO2: 0.35
O2 SAT: 95.4 %
O2 Saturation: 96.5 %
PCO2 ART: 64 mmHg — AB (ref 32.0–48.0)
PCO2 ART: 66 mmHg — AB (ref 32.0–48.0)
PEEP/CPAP: 5 cmH2O
PEEP: 5 cmH2O
PH ART: 7.32 — AB (ref 7.350–7.450)
PRESSURE SUPPORT: 5 cmH2O
Patient temperature: 37.1
Patient temperature: 37
RATE: 25 resp/min
VT: 400 mL
pH, Arterial: 7.3 — ABNORMAL LOW (ref 7.350–7.450)
pO2, Arterial: 85 mmHg (ref 83.0–108.0)
pO2, Arterial: 94 mmHg (ref 83.0–108.0)

## 2018-01-12 LAB — BASIC METABOLIC PANEL
ANION GAP: 3 — AB (ref 5–15)
BUN: 18 mg/dL (ref 6–20)
CALCIUM: 7.4 mg/dL — AB (ref 8.9–10.3)
CO2: 32 mmol/L (ref 22–32)
Chloride: 110 mmol/L (ref 98–111)
Creatinine, Ser: 0.45 mg/dL (ref 0.44–1.00)
GLUCOSE: 117 mg/dL — AB (ref 70–99)
POTASSIUM: 3.2 mmol/L — AB (ref 3.5–5.1)
SODIUM: 145 mmol/L (ref 135–145)

## 2018-01-12 LAB — CBC WITH DIFFERENTIAL/PLATELET
BASOS ABS: 0.1 10*3/uL (ref 0–0.1)
BASOS PCT: 1 %
EOS ABS: 0 10*3/uL (ref 0–0.7)
EOS PCT: 0 %
HCT: 34.3 % — ABNORMAL LOW (ref 35.0–47.0)
Hemoglobin: 11.1 g/dL — ABNORMAL LOW (ref 12.0–16.0)
LYMPHS PCT: 12 %
Lymphs Abs: 1.4 10*3/uL (ref 1.0–3.6)
MCH: 30.9 pg (ref 26.0–34.0)
MCHC: 32.4 g/dL (ref 32.0–36.0)
MCV: 95.5 fL (ref 80.0–100.0)
MONO ABS: 0.9 10*3/uL (ref 0.2–0.9)
Monocytes Relative: 8 %
Neutro Abs: 9.5 10*3/uL — ABNORMAL HIGH (ref 1.4–6.5)
Neutrophils Relative %: 79 %
PLATELETS: 164 10*3/uL (ref 150–440)
RBC: 3.59 MIL/uL — AB (ref 3.80–5.20)
RDW: 16.1 % — AB (ref 11.5–14.5)
WBC: 12 10*3/uL — ABNORMAL HIGH (ref 3.6–11.0)

## 2018-01-12 LAB — PHOSPHORUS
PHOSPHORUS: 1.7 mg/dL — AB (ref 2.5–4.6)
Phosphorus: 3 mg/dL (ref 2.5–4.6)

## 2018-01-12 LAB — MAGNESIUM: MAGNESIUM: 1.7 mg/dL (ref 1.7–2.4)

## 2018-01-12 LAB — POTASSIUM: Potassium: 4.1 mmol/L (ref 3.5–5.1)

## 2018-01-12 MED ORDER — SODIUM PHOSPHATES 45 MMOLE/15ML IV SOLN: 20.0000 mmol | Freq: Once | INTRAVENOUS | Status: DC

## 2018-01-12 MED ORDER — SODIUM CHLORIDE 0.9% FLUSH
10.0000 mL | INTRAVENOUS | Status: DC | PRN
  Administered 2018-01-13: 10 mL
  Filled 2018-01-12: qty 40

## 2018-01-12 MED ORDER — MAGNESIUM SULFATE 2 GM/50ML IV SOLN
2.0000 g | Freq: Once | INTRAVENOUS | Status: AC
  Administered 2018-01-12: 2 g via INTRAVENOUS
  Filled 2018-01-12: qty 50

## 2018-01-12 MED ORDER — ENOXAPARIN SODIUM 40 MG/0.4ML ~~LOC~~ SOLN
40.0000 mg | SUBCUTANEOUS | Status: DC
  Administered 2018-01-13: 40 mg via SUBCUTANEOUS
  Filled 2018-01-12: qty 0.4

## 2018-01-12 MED ORDER — POTASSIUM CHLORIDE 10 MEQ/50ML IV SOLN
10.0000 meq | INTRAVENOUS | Status: AC
  Administered 2018-01-12 (×4): 10 meq via INTRAVENOUS
  Filled 2018-01-12 (×4): qty 50

## 2018-01-12 MED ORDER — SODIUM CHLORIDE 0.9% FLUSH
10.0000 mL | Freq: Two times a day (BID) | INTRAVENOUS | Status: DC
  Administered 2018-01-13 (×2): 20 mL
  Administered 2018-01-13 – 2018-01-14 (×3): 10 mL
  Administered 2018-01-16: 30 mL

## 2018-01-12 MED ORDER — POTASSIUM PHOSPHATES 15 MMOLE/5ML IV SOLN
20.0000 mmol | Freq: Once | INTRAVENOUS | Status: AC
  Administered 2018-01-12: 20 mmol via INTRAVENOUS
  Filled 2018-01-12: qty 6.67

## 2018-01-12 MED ORDER — SODIUM CHLORIDE 0.9 % IV SOLN
INTRAVENOUS | Status: DC
  Administered 2018-01-13: 250 mL via INTRAVENOUS

## 2018-01-12 NOTE — Consult Note (Signed)
Psychiatry: Patient remains intubated on a ventilator.  No change to current supportive treatment plan.

## 2018-01-12 NOTE — Progress Notes (Signed)
Follow up - Critical Care Medicine Note  Patient Details:    Erica Schmidt is an 57 y.o. female. With a past medical history remarkable for COPD/emphysema, polysubstance abuse, recent stressors, found slumped over yesterday, was subsequently brought to the emergency department, questionable concern for suicidal ideation,urine drug screen was positive for benzodiazepine, alcohol was 60, salicylates was 19.9, was admitted to the floor, rapid response was called this afternoon as patient became apneic, unresponsive, unable to protect airway, was given Narcan and flumazenil,subsequently intubated by respiratory therapy on the floor and brought to the intensive care unit. Excessive secretions were noted from endotracheal tube.    Lines, Airways, Drains: Airway 8 mm (Active)  Secured at (cm) 23 cm 01/11/2018  4:00 AM  Measured From Lips 01/11/2018  4:00 AM  Secured Location Left 01/11/2018  4:00 AM  Secured By [employer] 01/11/2018  4:00 AM  Tube Holder Repositioned Yes 01/11/2018  3:57 AM  Cuff Pressure (cm H2O) 26 cm H2O 01/11/2018  3:57 AM  Site Condition Dry 01/11/2018  4:00 AM     CVC Triple Lumen 01/10/18 Right Femoral (Active)  Indication for Insertion or Continuance of Line Vasoactive infusions 01/10/2018  8:00 PM  Site Assessment Intact;Bleeding 01/10/2018  8:00 PM  Proximal Lumen Status Flushed;Saline locked;Blood return noted 01/10/2018  8:00 PM  Medial Lumen Status Flushed;Saline locked;Blood return noted 01/10/2018  8:00 PM  Distal Lumen Status Flushed;Saline locked;Blood return noted 01/10/2018  8:00 PM  Dressing Type Transparent;Occlusive 01/10/2018  8:00 PM  Dressing Status Intact;Antimicrobial disc in place;New drainage 01/10/2018  8:00 PM  Line Care Connections checked and tightened 01/10/2018  8:00 PM  Dressing Change Due 01/17/18 01/10/2018  8:00 PM     NG/OG Tube Orogastric Xray Documented cm marking at nare/ corner of mouth 62 cm (Active)  Cm Marking at Nare/Corner of Mouth (if  applicable) 62 cm 01/11/2018  4:00 AM  Site Assessment Clean;Dry;Intact 01/11/2018  4:00 AM  Ongoing Placement Verification No change in cm markings or external length of tube from initial placement;No change in respiratory status;No acute changes, not attributed to clinical condition;Xray 01/11/2018  4:00 AM  Status Infusing tube feed 01/11/2018  4:00 AM  Drainage Appearance None 01/11/2018  4:00 AM     Urethral Catheter Britta Mccreedy, RN 16 Fr. (Active)  Indication for Insertion or Continuance of Catheter Unstable critical patients (first 24-48 hours) 01/11/2018  4:00 AM  Site Assessment Clean;Intact;Dry 01/11/2018  4:00 AM  Catheter Maintenance Bag below level of bladder;Catheter secured;Drainage bag/tubing not touching floor;Insertion date on drainage bag;No dependent loops;Seal intact 01/11/2018  4:00 AM  Collection Container Standard drainage bag 01/11/2018  4:00 AM  Securement Method Securing device (Describe) 01/11/2018  4:00 AM  Urinary Catheter Interventions Unclamped 01/11/2018  4:00 AM  Output (mL) 70 mL 01/11/2018  6:00 AM    Anti-infectives:  Anti-infectives (From admission, onward)   Start     Dose/Rate Route Frequency Ordered Stop   01/11/18 0800  vancomycin (VANCOCIN) 500 mg in sodium chloride 0.9 % 100 mL IVPB  Status:  Discontinued     500 mg 100 mL/hr over 60 Minutes Intravenous Every 18 hours 01/10/18 1915 01/11/18 1042   01/10/18 2000  Ampicillin-Sulbactam (UNASYN) 3 g in sodium chloride 0.9 % 100 mL IVPB     3 g 200 mL/hr over 30 Minutes Intravenous Every 6 hours 01/10/18 1841     01/10/18 1915  vancomycin (VANCOCIN) IVPB 750 mg/150 ml premix     750 mg 150 mL/hr over  60 Minutes Intravenous  Once 01/10/18 1910 01/10/18 2043      Microbiology: Results for orders placed or performed during the hospital encounter of 01/09/18  Culture, respiratory (NON-Expectorated)     Status: None (Preliminary result)   Collection Time: 01/10/18  6:30 PM  Result Value Ref Range Status   Specimen  Description   Final    TRACHEAL ASPIRATE Performed at [facility], [address] [place], [place] [address]    Special Requests   Final    NONE Performed at [facility], [address] [place], [place] [address]    Gram Stain   Final    ABUNDANT WBC PRESENT, PREDOMINANTLY PMN FEW GRAM POSITIVE COCCI FEW GRAM POSITIVE RODS    Culture   Final    CULTURE REINCUBATED FOR BETTER GROWTH Performed at [facility], [address] [place], [place] [address]    Report Status PENDING  Incomplete  MRSA PCR Screening     Status: None   Collection Time: 01/10/18  7:44 PM  Result Value Ref Range Status   MRSA by PCR NEGATIVE NEGATIVE Final    Comment:        The GeneXpert MRSA Assay (FDA approved for NASAL specimens only), is one component of a comprehensive MRSA colonization surveillance program. It is not intended to diagnose MRSA infection nor to guide or monitor treatment for MRSA infections. Performed at [facility], [address] [place], [place] [address]   Culture, respiratory (NON-Expectorated)     Status: None (Preliminary result)   Collection Time: 01/11/18 12:37 AM  Result Value Ref Range Status   Specimen Description   Final    TRACHEAL ASPIRATE Performed at [facility], [address] [place], [place] [address]    Special Requests   Final    NONE Performed at [facility], [address] [place], [place] [address]    Gram Stain   Final    FEW WBC PRESENT, PREDOMINANTLY PMN RARE GRAM POSITIVE RODS Performed at [facility], [address], [place], [place] [address]    Culture PENDING  Incomplete   Report Status PENDING  Incomplete    Studies: Dg Abd 1 View  Result Date: 01/10/2018 CLINICAL DATA:  Nasogastric tube placement EXAM: ABDOMEN - 1 VIEW COMPARISON:  None. FINDINGS: Nasogastric tube tip and side port are in the stomach. There is no bowel dilatation or air-fluid level to suggest  bowel obstruction. No free air. Lung bases are clear. Lungs appear hyperexpanded. IMPRESSION: Nasogastric tube tip and side port in stomach. No bowel obstruction or free air evident. Electronically Signed   By: Bretta Bang III M.D.   On: 01/10/2018 20:10   Ct Head Wo Contrast  Result Date: 01/09/2018 CLINICAL DATA:  57 year old female with altered mental status. EXAM: CT HEAD WITHOUT CONTRAST TECHNIQUE: Contiguous axial images were obtained from the base of the skull through the vertex without intravenous contrast. COMPARISON:  None. FINDINGS: Brain: The ventricles and sulci appropriate size for patient's age. Mild periventricular and deep white matter chronic microvascular ischemic changes noted. There is no acute intracranial hemorrhage. No mass effect or midline shift. No extra-axial fluid collection. Vascular: No hyperdense vessel or unexpected calcification. Skull: Normal. Negative for fracture or focal lesion. Sinuses/Orbits: No acute finding. Other: None IMPRESSION: 1. No acute intracranial pathology. 2. Mild chronic microvascular ischemic changes. Electronically Signed   By: Elgie Collard M.D.   On: 01/09/2018 22:31   Dg Chest  Port 1 View  Result Date: 01/11/2018 CLINICAL DATA:  Intubation.  COPD EXAM: PORTABLE CHEST 1 VIEW COMPARISON:  Yesterday FINDINGS: Endotracheal tube tip between the clavicular heads and carina. An orogastric tube reaches the stomach. Hyperinflation and interstitial coarsening. There is no consolidation, effusion, or pneumothorax. Artifact from EKG leads and other hardware. Remote and nonunited right sixth and seventh rib fractures. IMPRESSION: 1. COPD.  Symmetric interstitial opacities is likely bronchitic. 2. Stable hardware positioning. Electronically Signed   By: Marnee SpringJonathon  Watts M.D.   On: 01/11/2018 09:52   Dg Chest Port 1 View  Result Date: 01/10/2018 CLINICAL DATA:  Tube placement, cardiac arrest EXAM: PORTABLE CHEST 1 VIEW COMPARISON:  Portable exam 1826 hours  compared to 01/09/2018 FINDINGS: Tip of endotracheal tube projects 3.8 cm above carina. Nasogastric tube extends to at least the distal esophagus. Lung bases excluded. Normal heart size, mediastinal contours, and pulmonary vascularity. Emphysematous changes with mild peribronchial thickening and probable mild perihilar edema. No gross pleural effusion or pneumothorax. Bones demineralized with old fractures of the posterior RIGHT sixth and seventh ribs. IMPRESSION: COPD changes with BILATERAL perihilar infiltrates likely representing pulmonary edema. Electronically Signed   By: Ulyses SouthwardMark  Boles M.D.   On: 01/10/2018 19:00   Dg Chest Port 1 View  Result Date: 01/09/2018 CLINICAL DATA:  Hypoxia with altered mental status EXAM: PORTABLE CHEST 1 VIEW COMPARISON:  None. FINDINGS: Hyperinflation with emphysematous disease. No acute consolidation or effusion. Cardiomediastinal silhouette within normal limits. Aortic atherosclerosis. No pneumothorax. Old appearing right sixth and seventh rib deformities. IMPRESSION: No active disease.  Hyperinflation with emphysematous disease. Electronically Signed   By: Jasmine PangKim  Fujinaga M.D.   On: 01/09/2018 22:19    Consults: Treatment Team:  Shari ProwsPucilowska, Jolanta B, MD Clapacs, Jackquline DenmarkJohn T, MD   Subjective:    Overnight Issues: No significant issues overnight. Levo fed has been weaned to 2 cg  Objective:  Vital signs for last 24 hours: Temp:  [97.9 F (36.6 C)-99 F (37.2 C)] 98.7 F (37.1 C) (07/04 0400) Pulse Rate:  [62-95] 80 (07/04 0700) Resp:  [17-29] 25 (07/04 0700) BP: (82-115)/(53-76) 95/63 (07/04 0700) SpO2:  [94 %-100 %] 97 % (07/04 0700) FiO2 (%):  [35 %-50 %] 35 % (07/04 0341) Weight:  [105 lb 13.1 oz (48 kg)] 105 lb 13.1 oz (48 kg) (07/04 0500)  Hemodynamic parameters for last 24 hours:    Intake/Output from previous day: 07/03 0701 - 07/04 0700 In: 1452.2 [I.V.:732.2; NG/GT:320; IV Piggyback:400] Out: 820 [Urine:820]  Intake/Output this shift: No  intake/output data recorded.  Vent settings for last 24 hours: Vent Mode: PRVC FiO2 (%):  [35 %-50 %] 35 % Set Rate:  [25 bmp] 25 bmp Vt Set:  [400 mL] 400 mL PEEP:  [5 cmH20] 5 cmH20 Plateau Pressure:  [15 cmH20] 15 cmH20  Physical Exam:   Vital signs:       Please see the above listed vital signs HEENT:           Patient is edentulous, endotracheal tube in place, OGT, no distended neck veins, trachea midline Cardiovascular:           Regular rhythm Pulmonary:      Prolonged expiratory phase Abdominal:      Positive bowel sounds, soft exam Extremities:     No clubbing cyanosis or edema noted Neurologic:      Patient is somnolent but starting to wake up Cutaneous:      No rashes or lesions noted    Assessment/Plan:  Respiratory failure. Patient's urine drug screen was noted, positive for benzodiazepine and alcohol, found unresponsive on the floor, most likely aspiration as the patient's endotracheal tube is full of secretions. On mechanical ventilation protocol, started on vancomycin and Unasyn, pending respiratory culture data on albuterol, Atrovent and budesonide. Will try spontaneous awakening trial today and if tolerates consider spontaneous breathing trial. Patient is still with a respiratory acidosis so we'll need to recheck arterial blood gas right are considering extubation  COPD. On mechanical ventilation and bronchodilators  Septic shock. On Unasyn and vancomycin, etiology most likely aspiration event. Will continue to wean norepinephrine as tolerated  Suicidal ideation. Will need psychiatry follow-up post extubation  On CIWA protocol for possible benzodiazepine and alcohol withdrawal  Leukocytosis.   Hypophosphatemia/hypokalemia/borderline hypomagnesemia.We'll place on electrolyte replacement protocol   Tora Kindred, DO  Critical Care Total Time 40 minutes  Erica Schmidt 01/12/2018  *Care during the described time interval was provided by me and/or other  providers on the critical care team.  I have reviewed this patient's available data, including medical history, events of note, physical examination and test results as part of my evaluation. Patient ID: Erica Schmidt, female   DOB: 10-Sep-1960, 57 y.o.   MRN: [id number]

## 2018-01-12 NOTE — Consult Note (Signed)
MEDICATION RELATED CONSULT NOTE - INITIAL   Pharmacy Consult for electrolytes Indication: electrolytes abnormalities  No Known Allergies  Patient Measurements: Height: _0  (157.5 cm) Weight: 105 lb 13.1 oz (48 kg) IBW/kg (Calculated) : 50.1 Adjusted Body Weight:   Vital Signs: Temp: 99 F (37.2 C) (07/04 0700) Temp Source: Oral (07/04 0700) BP: 96/63 (07/04 1200) Pulse Rate: 86 (07/04 1200) Intake/Output from previous day: 07/03 0701 - 07/04 0700 In: 1452.2 [I.V.:732.2; NG/GT:320; IV Piggyback:400] Out: 820 [Urine:820] Intake/Output from this shift: Total I/O In: 343.1 [I.V.:86.4; NG/GT:256.7] Out: -   Labs: Recent Labs    01/09/18 2212  01/10/18 1918  01/11/18 0354 01/11/18 1638 01/12/18 0353 01/12/18 1438  WBC 8.7   < > 24.5*  --  26.4*  --  12.0*  --   HGB 14.7   < > 13.1  --  12.1  --  11.1*  --   HCT 44.4   < > 42.5  --  38.1  --  34.3*  --   PLT 273   < > 250  --  251  --  164  --   CREATININE 0.73   < > 0.56  --  0.46  --  0.45  --   MG  --   --   --    < > 2.7* 2.0 1.7  --   PHOS  --   --   --    < > 2.5 2.2* 1.7* 3.0  ALBUMIN 3.8  --  3.0*  --  2.6*  --   --   --   PROT 8.3*  --  6.2*  --  5.5*  --   --   --   AST 28  --  48*  --  43*  --   --   --   ALT 17  --  26  --  26  --   --   --   ALKPHOS 51  --  48  --  42  --   --   --   BILITOT 0.2*  --  0.5  --  0.6  --   --   --    < > = values in this interval not displayed.   Estimated Creatinine Clearance: 58.1 mL/min (by C-G formula based on SCr of 0.45 mg/dL).   Microbiology: Recent Results (from the past 720 hour(s))  Culture, respiratory (NON-Expectorated)     Status: None (Preliminary result)   Collection Time: 01/10/18  6:30 PM  Result Value Ref Range Status   Specimen Description   Final    TRACHEAL ASPIRATE Performed at [facility], [address], [place], [place] [address]    Special Requests   Final    NONE Performed at [facility], [address]  [place], [place] [address]    Gram Stain   Final    ABUNDANT WBC PRESENT, PREDOMINANTLY PMN FEW GRAM POSITIVE COCCI FEW GRAM POSITIVE RODS    Culture   Final    ABUNDANT HAEMOPHILUS INFLUENZAE BETA LACTAMASE NEGATIVE Performed at [facility], [place] [address] [place], [place] [address]    Report Status PENDING  Incomplete  MRSA PCR Screening     Status: None   Collection Time: 01/10/18  7:44 PM  Result Value Ref Range Status   MRSA by PCR NEGATIVE NEGATIVE Final    Comment:        The GeneXpert MRSA Assay (FDA approved for NASAL specimens only), is  one component of a comprehensive MRSA colonization surveillance program. It is not intended to diagnose MRSA infection nor to guide or monitor treatment for MRSA infections. Performed at [facility], [address] [place], [place] [address]   Culture, respiratory (NON-Expectorated)     Status: None (Preliminary result)   Collection Time: 01/11/18 12:37 AM  Result Value Ref Range Status   Specimen Description   Final    TRACHEAL ASPIRATE Performed at [facility], [address] [place], [place] [address]    Special Requests   Final    NONE Performed at [facility] [address] [place], [place] [address]    Gram Stain   Final    FEW WBC PRESENT, PREDOMINANTLY PMN RARE GRAM POSITIVE RODS    Culture   Final    CULTURE REINCUBATED FOR BETTER GROWTH Performed at [facility], [place] [address], [place], [place] [address]    Report Status PENDING  Incomplete    Medical History: Past Medical History:  Diagnosis Date  . COPD (chronic obstructive pulmonary disease) ([place])   . Emphysema of lung ([place])   . Polysubstance abuse ([facility])     Medications:  Scheduled:  . budesonide (PULMICORT) nebulizer solution  0.25 mg Nebulization BID  . chlorhexidine gluconate (MEDLINE KIT)  15 mL Mouth Rinse BID  . [START ON 01/13/2018] enoxaparin (LOVENOX) injection  40 mg Subcutaneous Q24H  .  famotidine  20 mg Per Tube Daily  . feeding supplement (VITAL HIGH PROTEIN)  1,000 mL Per Tube Q24H  . folic acid  1 mg Oral Daily  . free water  100 mL Per Tube Q6H  . insulin aspart  0-9 Units Subcutaneous Q4H  . ipratropium-albuterol  3 mL Nebulization Q6H  . mouth rinse  15 mL Mouth Rinse 10 times per day  . multivitamin  15 mL Per Tube Daily  . thiamine  100 mg Oral Daily   Or  . thiamine  100 mg Intravenous Daily    Assessment: Patient is a 57 year old female admitted with possible overdose/respiratory distress. Pt found to have phos of 1.7, Mg 1.7 and K 3.2. Pharmacy consulted to manage electrolytes. Kphos 20 mmol were already ordered by ICU NP and was given 3 runs of KCL 10 MEQ.   Goal of Therapy:  K 3.5-5 Mg 1.8-2.4 Phos 2.5-4.6  Plan:  Will continue with current orders and check K and phos after Kphos completes at 1430.  01/12/18 14:38 K 4.1, Phos 3. No further supplement indicate at this time. Will follow up with AM labs.   Harlow Basley A. [place], [place].D, BCPS Clinical Pharmacist 01/12/2018,3:12 PM

## 2018-01-12 NOTE — Progress Notes (Signed)
[employer] - [place] [facility]   PATIENT NAME: Erica Schmidt    MR#:  [id number]  DATE OF BIRTH:  1961/06/27  SUBJECTIVE:  CHIEF COMPLAINT:   Chief Complaint  Patient presents with  . Drug Overdose  stable, on vent  REVIEW OF SYSTEMS:  CONSTITUTIONAL: No fever, fatigue or weakness.  EYES: No blurred or double vision.  EARS, NOSE, AND THROAT: No tinnitus or ear pain.  RESPIRATORY: No cough, shortness of breath, wheezing or hemoptysis.  CARDIOVASCULAR: No chest pain, orthopnea, edema.  GASTROINTESTINAL: No nausea, vomiting, diarrhea or abdominal pain.  GENITOURINARY: No dysuria, hematuria.  ENDOCRINE: No polyuria, nocturia,  HEMATOLOGY: No anemia, easy bruising or bleeding SKIN: No rash or lesion. MUSCULOSKELETAL: No joint pain or arthritis.   NEUROLOGIC: No tingling, numbness, weakness.  PSYCHIATRY: No anxiety or depression.   ROS  DRUG ALLERGIES:  No Known Allergies  VITALS:  Blood pressure 129/78, pulse 94, temperature 99 F (37.2 C), temperature source Oral, resp. rate 10, height 5\' 2"  (1.575 m), weight 48 kg (105 lb 13.1 oz), SpO2 98 %.  PHYSICAL EXAMINATION:  GENERAL:  57 y.o.-year-old patient lying in the bed with no acute distress.  EYES: Pupils equal, round, reactive to light and accommodation. No scleral icterus. Extraocular muscles intact.  HEENT: Head atraumatic, normocephalic. Oropharynx and nasopharynx clear.  NECK:  Supple, no jugular venous distention. No thyroid enlargement, no tenderness.  LUNGS: Normal breath sounds bilaterally, no wheezing, rales,rhonchi or crepitation. No use of accessory muscles of respiration.  CARDIOVASCULAR: S1, S2 normal. No murmurs, rubs, or gallops.  ABDOMEN: Soft, nontender, nondistended. Bowel sounds present. No organomegaly or mass.  EXTREMITIES: No pedal edema, cyanosis, or clubbing.  NEUROLOGIC: Cranial nerves II through XII are intact. Muscle strength 5/5 in all extremities. Sensation intact. Gait  not checked.  PSYCHIATRIC: The patient is alert and oriented x 3.  SKIN: No obvious rash, lesion, or ulcer.   Physical Exam LABORATORY PANEL:   CBC Recent Labs  Lab 01/12/18 0353  WBC 12.0*  HGB 11.1*  HCT 34.3*  PLT 164   ------------------------------------------------------------------------------------------------------------------  Chemistries  Recent Labs  Lab 01/11/18 0354  01/12/18 0353  NA 142  --  145  K 4.0  --  3.2*  CL 112*  --  110  CO2 25  --  32  GLUCOSE 159*  --  117*  BUN 15  --  18  CREATININE 0.46  --  0.45  CALCIUM 7.8*  --  7.4*  MG 2.7*   < > 1.7  AST 43*  --   --   ALT 26  --   --   ALKPHOS 42  --   --   BILITOT 0.6  --   --    < > = values in this interval not displayed.   ------------------------------------------------------------------------------------------------------------------  Cardiac Enzymes Recent Labs  Lab 01/09/18 2212 01/10/18 1918  TROPONINI <0.03 <0.03   ------------------------------------------------------------------------------------------------------------------  RADIOLOGY:  Dg Abd 1 View  Result Date: 01/10/2018 CLINICAL DATA:  Nasogastric tube placement EXAM: ABDOMEN - 1 VIEW COMPARISON:  None. FINDINGS: Nasogastric tube tip and side port are in the stomach. There is no bowel dilatation or air-fluid level to suggest bowel obstruction. No free air. Lung bases are clear. Lungs appear hyperexpanded. IMPRESSION: Nasogastric tube tip and side port in stomach. No bowel obstruction or free air evident. Electronically Signed   By: Bretta Bang III M.D.   On: 01/10/2018 20:10   Dg Chest Port 1 [address]  Result Date: 01/12/2018 CLINICAL DATA:  Ventilator dependence. EXAM: PORTABLE CHEST 1 VIEW COMPARISON:  01/11/2018 FINDINGS: 0919 hours. Endotracheal tube tip is 2.5 cm above the base of the carina. The NG tube passes into the stomach although the distal tip position is not included on the film. Lungs are hyperexpanded.  Interstitial markings are diffusely coarsened with chronic features. The cardiopericardial silhouette is within normal limits for size. Bones are diffusely demineralized with nonacute posterior right rib fractures. Telemetry leads overlie the chest. IMPRESSION: 1. Stable exam. 2. Emphysema with underlying chronic interstitial disease. Electronically Signed   By: Kennith CenterEric  Mansell M.D.   On: 01/12/2018 09:38   Dg Chest Port 1 View  Result Date: 01/11/2018 CLINICAL DATA:  Intubation.  COPD EXAM: PORTABLE CHEST 1 VIEW COMPARISON:  Yesterday FINDINGS: Endotracheal tube tip between the clavicular heads and carina. An orogastric tube reaches the stomach. Hyperinflation and interstitial coarsening. There is no consolidation, effusion, or pneumothorax. Artifact from EKG leads and other hardware. Remote and nonunited right sixth and seventh rib fractures. IMPRESSION: 1. COPD.  Symmetric interstitial opacities is likely bronchitic. 2. Stable hardware positioning. Electronically Signed   By: Marnee SpringJonathon  Watts M.D.   On: 01/11/2018 09:52   Dg Chest Port 1 View  Result Date: 01/10/2018 CLINICAL DATA:  Tube placement, cardiac arrest EXAM: PORTABLE CHEST 1 VIEW COMPARISON:  Portable exam 1826 hours compared to 01/09/2018 FINDINGS: Tip of endotracheal tube projects 3.8 cm above carina. Nasogastric tube extends to at least the distal esophagus. Lung bases excluded. Normal heart size, mediastinal contours, and pulmonary vascularity. Emphysematous changes with mild peribronchial thickening and probable mild perihilar edema. No gross pleural effusion or pneumothorax. Bones demineralized with old fractures of the posterior RIGHT sixth and seventh ribs. IMPRESSION: COPD changes with BILATERAL perihilar infiltrates likely representing pulmonary edema. Electronically Signed   By: Ulyses SouthwardMark  Boles M.D.   On: 01/10/2018 19:00    ASSESSMENT AND PLAN:  *Aspiration pneumonia with acute hypoxic respiratory failure and septic  shock Stable Continue Unasyn/vancomycin, follow-up on cultures, ventilator with weaning as tolerated  *Acute hypoxic respiratory failure Stable on ventilator plan of care as stated above  *Polysubstance abuse Stable Psychiatry following  *Hypertension Antihypertensives currently on hold for septic shock   All the records are reviewed and case discussed with Care Management/Social Workerr. Management plans discussed with the patient, family and they are in agreement.  CODE STATUS: full  TOTAL TIME TAKING CARE OF THIS PATIENT: 35 minutes.     POSSIBLE D/C IN 2-5 DAYS, DEPENDING ON CLINICAL CONDITION.   [name] M.D on 01/12/2018   Between 7am to 6pm - Pager - [phone]  After 6pm go to [email or web address] - Social research officer, governmentpassword [email or web address] [email or web address]  [facility]  Office  [phone]  CC: Primary care physician; Inc., [facility]  Note: This dictation was prepared with Dragon dictation along with smaller phrase technology. Any transcriptional errors that result from this process are unintentional.

## 2018-01-12 NOTE — Progress Notes (Signed)
Pharmacy Antibiotic Note  Erica RilyDeborah L Schmidt is a 57 y.o. female admitted on 01/09/2018 with aspiration pneumonia.  Pharmacy has been consulted for vancomycin and Unasyn dosing.  Plan: Unasyn 3 g IV q6h  Vancomycin has been discontinued, MRSA PCR is negative.    Height: 5\' 2"  (157.5 cm) Weight: 105 lb 13.1 oz (48 kg) IBW/kg (Calculated) : 50.1  Temp (24hrs), Avg:98.7 F (37.1 C), Min:98.1 F (36.7 C), Max:99 F (37.2 C)  Recent Labs  Lab 01/09/18 2212 01/10/18 0604 01/10/18 1918 01/11/18 0354 01/12/18 0353  WBC 8.7 8.7 24.5* 26.4* 12.0*  CREATININE 0.73 0.37* 0.56 0.46 0.45    Estimated Creatinine Clearance: 58.1 mL/min (by C-G formula based on SCr of 0.45 mg/dL).    No Known Allergies  Antimicrobials this admission: Vanc/Unasyn 7/2>>  Dose adjustments this admission:   Microbiology results: 7/2 trach aspirate sent 7/2 MRSA PCR: ordered  Thank you for allowing pharmacy to be a part of this patient's care.  Carola Frostathan A Dosha Broshears 01/12/2018 2:04 PM

## 2018-01-12 NOTE — Progress Notes (Signed)
Pharmacist - Prescriber Communication  Enoxaparin dose modified from 30 mg subcutaneously once daily to 40 mg subcutaneously once daily due to improved creatinine clearance.  Alta Shober A. Grapelandookson, VermontPharm.D., BCPS Clinical Pharmacist 01/12/2018 09:32

## 2018-01-12 NOTE — Progress Notes (Signed)
Patient able to follows commands during sedation vacation and spontaneous breathing trial but becoming more lethargic and rr 9 to 10 rate per minutes at times.  Maintaining O2 sat in 94%.  Dr Lonn Georgiaonforti in room, states that patient CO2 is still a bit high and to resume ventilation and sedation for today.

## 2018-01-12 NOTE — Progress Notes (Signed)
Patient was sleeping, making a spiritual assessment impossible. Conversation with the patient's nurse noted an effort to extubate the patient.  Emotional support by Chaplain may benefit the patient later today. It is unclear whether the patient will respond favorably to spiritual support. Chaplain offered the patient energetic prayer and intentional presence.

## 2018-01-12 NOTE — Progress Notes (Signed)
RT, Erica NajjarLarry in room to place patient back on full vent support with rate of 18.  Patient restarted on diprivan gtt@40mcg /kg/hr. Will continue to monitor.

## 2018-01-12 NOTE — Consult Note (Signed)
MEDICATION RELATED CONSULT NOTE - INITIAL   Pharmacy Consult for electrolytes Indication: electrolytes abnormalities  No Known Allergies  Patient Measurements: Height: 5' 2"  (157.5 cm) Weight: 105 lb 13.1 oz (48 kg) IBW/kg (Calculated) : 50.1 Adjusted Body Weight:   Vital Signs: Temp: 99 F (37.2 C) (07/04 0700) Temp Source: Oral (07/04 0700) BP: 99/70 (07/04 0800) Pulse Rate: 87 (07/04 0800) Intake/Output from previous day: 07/03 0701 - 07/04 0700 In: 1452.2 [I.V.:732.2; NG/GT:320; IV Piggyback:400] Out: 820 [Urine:820] Intake/Output from this shift: No intake/output data recorded.  Labs: Recent Labs    01/09/18 2212  01/10/18 1918  01/11/18 0354 01/11/18 1638 01/12/18 0353  WBC 8.7   < > 24.5*  --  26.4*  --  12.0*  HGB 14.7   < > 13.1  --  12.1  --  11.1*  HCT 44.4   < > 42.5  --  38.1  --  34.3*  PLT 273   < > 250  --  251  --  164  CREATININE 0.73   < > 0.56  --  0.46  --  0.45  MG  --   --   --    < > 2.7* 2.0 1.7  PHOS  --   --   --    < > 2.5 2.2* 1.7*  ALBUMIN 3.8  --  3.0*  --  2.6*  --   --   PROT 8.3*  --  6.2*  --  5.5*  --   --   AST 28  --  48*  --  43*  --   --   ALT 17  --  26  --  26  --   --   ALKPHOS 51  --  48  --  42  --   --   BILITOT 0.2*  --  0.5  --  0.6  --   --    < > = values in this interval not displayed.   Estimated Creatinine Clearance: 58.1 mL/min (by C-G formula based on SCr of 0.45 mg/dL).   Microbiology: Recent Results (from the past 720 hour(s))  Culture, respiratory (NON-Expectorated)     Status: None (Preliminary result)   Collection Time: 01/10/18  6:30 PM  Result Value Ref Range Status   Specimen Description   Final    TRACHEAL ASPIRATE Performed at [facility], [address] [place], [place] [address]    Special Requests   Final    NONE Performed at [facility], [address] [place], [place] [address]    Gram Stain   Final    ABUNDANT WBC PRESENT, PREDOMINANTLY PMN FEW GRAM  POSITIVE COCCI FEW GRAM POSITIVE RODS    Culture   Final    CULTURE REINCUBATED FOR BETTER GROWTH Performed at [facility], [place] [address], [place], [place] [address]    Report Status PENDING  Incomplete  MRSA PCR Screening     Status: None   Collection Time: 01/10/18  7:44 PM  Result Value Ref Range Status   MRSA by PCR NEGATIVE NEGATIVE Final    Comment:        The GeneXpert MRSA Assay (FDA approved for NASAL specimens only), is one component of a comprehensive MRSA colonization surveillance program. It is not intended to diagnose MRSA infection nor to guide or monitor treatment for MRSA infections. Performed at [facility], [address] [place], [place] [address]   Culture, respiratory (NON-Expectorated)  Status: None (Preliminary result)   Collection Time: 01/11/18 12:37 AM  Result Value Ref Range Status   Specimen Description   Final    TRACHEAL ASPIRATE Performed at [facility], [address], [place], [place] [address]    Special Requests   Final    NONE Performed at [facility], [address] [place], [place] [address]    Gram Stain   Final    FEW WBC PRESENT, PREDOMINANTLY PMN RARE GRAM POSITIVE RODS    Culture   Final    CULTURE REINCUBATED FOR BETTER GROWTH Performed at [facility], [place] [address] [place], [place] [address]    Report Status PENDING  Incomplete    Medical History: Past Medical History:  Diagnosis Date  . COPD (chronic obstructive pulmonary disease) ([place])   . Emphysema of lung ([place])   . Polysubstance abuse ([facility])     Medications:  Scheduled:  . budesonide (PULMICORT) nebulizer solution  0.25 mg Nebulization BID  . chlorhexidine gluconate (MEDLINE KIT)  15 mL Mouth Rinse BID  . [START ON 01/13/2018] enoxaparin (LOVENOX) injection  40 mg Subcutaneous Q24H  . famotidine  20 mg Per Tube Daily  . feeding supplement (VITAL HIGH PROTEIN)  1,000 mL Per Tube Q24H  . folic acid  1 mg  Oral Daily  . free water  100 mL Per Tube Q6H  . insulin aspart  0-9 Units Subcutaneous Q4H  . ipratropium-albuterol  3 mL Nebulization Q6H  . mouth rinse  15 mL Mouth Rinse 10 times per day  . multivitamin  15 mL Per Tube Daily  . thiamine  100 mg Oral Daily   Or  . thiamine  100 mg Intravenous Daily    Assessment: Patient is a 57 year old female admitted with possible overdose/respiratory distress. Pt found to have phos of 1.7, Mg 1.7 and K 3.2. Pharmacy consulted to manage electrolytes. Kphos 20 mmol were already ordered by ICU NP and was given 3 runs of KCL 10 MEQ.   Goal of Therapy:  K 3.5-5 Mg 1.8-2.4 Phos 2.5-4.6  Plan:  Will continue with current orders and check K and phos after Kphos completes at 1430.  Ramond Dial, Pharm.D, BCPS Clinical Pharmacist 01/12/2018,9:55 AM

## 2018-01-13 DIAGNOSIS — J449 Chronic obstructive pulmonary disease, unspecified: Secondary | ICD-10-CM

## 2018-01-13 DIAGNOSIS — J9601 Acute respiratory failure with hypoxia: Secondary | ICD-10-CM

## 2018-01-13 LAB — BASIC METABOLIC PANEL
Anion gap: 4 — ABNORMAL LOW (ref 5–15)
BUN: 11 mg/dL (ref 6–20)
CALCIUM: 8.1 mg/dL — AB (ref 8.9–10.3)
CO2: 34 mmol/L — AB (ref 22–32)
CREATININE: 0.33 mg/dL — AB (ref 0.44–1.00)
Chloride: 104 mmol/L (ref 98–111)
GFR calc non Af Amer: >60 mL/min (ref >60)
GLUCOSE: 110 mg/dL — AB (ref 70–99)
Potassium: 3.7 mmol/L (ref 3.5–5.1)
Sodium: 142 mmol/L (ref 135–145)

## 2018-01-13 LAB — CULTURE, RESPIRATORY W GRAM STAIN

## 2018-01-13 LAB — GLUCOSE, CAPILLARY
GLUCOSE-CAPILLARY: 87 mg/dL (ref 70–99)
GLUCOSE-CAPILLARY: 99 mg/dL (ref 70–99)
Glucose-Capillary: 100 mg/dL — ABNORMAL HIGH (ref 70–99)
Glucose-Capillary: 101 mg/dL — ABNORMAL HIGH (ref 70–99)
Glucose-Capillary: 122 mg/dL — ABNORMAL HIGH (ref 70–99)
Glucose-Capillary: 92 mg/dL (ref 70–99)

## 2018-01-13 LAB — BLOOD GAS, ARTERIAL
ACID-BASE EXCESS: 10.9 mmol/L — AB (ref 0.0–2.0)
Allens test (pass/fail): POSITIVE — AB
BICARBONATE: 37.2 mmol/L — AB (ref 20.0–28.0)
FIO2: 28
Mode: POSITIVE
O2 SAT: 94.5 %
PATIENT TEMPERATURE: 37
PCO2 ART: 56 mmHg — AB (ref 32.0–48.0)
PEEP: 5 cmH2O
PH ART: 7.43 (ref 7.350–7.450)
PO2 ART: 71 mmHg — AB (ref 83.0–108.0)
Pressure support: 10 cmH2O

## 2018-01-13 LAB — CULTURE, RESPIRATORY

## 2018-01-13 LAB — MAGNESIUM: Magnesium: 1.9 mg/dL (ref 1.7–2.4)

## 2018-01-13 LAB — TRIGLYCERIDES: Triglycerides: 29 mg/dL (ref <150)

## 2018-01-13 LAB — PHOSPHORUS: Phosphorus: 3.1 mg/dL (ref 2.5–4.6)

## 2018-01-13 LAB — PROCALCITONIN: PROCALCITONIN: 0.53 ng/mL

## 2018-01-13 MED ORDER — SODIUM CHLORIDE 0.9 % IV SOLN
3.0000 g | Freq: Four times a day (QID) | INTRAVENOUS | Status: DC
  Administered 2018-01-13 – 2018-01-16 (×10): 3 g via INTRAVENOUS
  Filled 2018-01-13 (×17): qty 3

## 2018-01-13 MED ORDER — FAMOTIDINE 40 MG/5ML PO SUSR
20.0000 mg | Freq: Two times a day (BID) | ORAL | Status: DC
  Administered 2018-01-13 – 2018-01-16 (×5): 20 mg
  Filled 2018-01-13 (×9): qty 2.5

## 2018-01-13 MED ORDER — ENOXAPARIN SODIUM 40 MG/0.4ML ~~LOC~~ SOLN
40.0000 mg | SUBCUTANEOUS | Status: DC
  Administered 2018-01-14 – 2018-01-15 (×2): 40 mg via SUBCUTANEOUS
  Filled 2018-01-13 (×2): qty 0.4

## 2018-01-13 MED ORDER — BUDESONIDE 0.5 MG/2ML IN SUSP
0.5000 mg | Freq: Two times a day (BID) | RESPIRATORY_TRACT | Status: DC
  Administered 2018-01-13 – 2018-01-16 (×6): 0.5 mg via RESPIRATORY_TRACT
  Filled 2018-01-13 (×6): qty 2

## 2018-01-13 NOTE — Progress Notes (Signed)
Pharmacy Antibiotic Note  Erica Schmidt is a 57 y.o. female admitted on 01/09/2018 with aspiration pneumonia.  Patient admitted unresponsive with potential overdose. Pharmacy has been consulted for Unasyn dosing.  Plan: Unasyn 3g IV Q6hr; per ICU rounds will treat for total of 8 days.   Height: 5\' 2"  (157.5 cm) Weight: 111 lb 8.8 oz (50.6 kg) IBW/kg (Calculated) : 50.1  Temp (24hrs), Avg:98.1 F (36.7 C), Min:97.6 F (36.4 C), Max:98.9 F (37.2 C)  Recent Labs  Lab 01/09/18 2212 01/10/18 0604 01/10/18 1918 01/11/18 0354 01/12/18 0353 01/13/18 0451  WBC 8.7 8.7 24.5* 26.4* 12.0*  --   CREATININE 0.73 0.37* 0.56 0.46 0.45 0.33*    Estimated Creatinine Clearance: 60.6 mL/min (A) (by C-G formula based on SCr of 0.33 mg/dL (L)).    No Known Allergies  Antimicrobials this admission: Vancomycin 7/2 >> 7/3  Unasyn  7/2 >> 7/9  Dose adjustments this admission: N/A  Microbiology results: 7/2 Sputum: Abundant Haemophilus Influenzae  7/2 MRSA PCR: negative   Thank you for allowing pharmacy to be a part of this patient's care.  Simpson,Michael L 01/13/2018 3:40 PM

## 2018-01-13 NOTE — Consult Note (Signed)
Psychiatry: Patient remains intubated on a ventilator.  No change to current treatment plan.  I will sign this out over the weekend if psychiatric care is needed please call the doctor on call.

## 2018-01-13 NOTE — Progress Notes (Signed)
[employer] [facility] [facility] [place] [facility]   PATIENT NAME: Erica Schmidt    MR#:  [id number]  DATE OF BIRTH:  October 03, 1960  SUBJECTIVE:  CHIEF COMPLAINT:   Chief Complaint  Patient presents with  . Drug Overdose  Patient remains vent dependent, case discussed with intensivist, wean vent as tolerated  REVIEW OF SYSTEMS:  CONSTITUTIONAL: No fever, fatigue or weakness.  EYES: No blurred or double vision.  EARS, NOSE, AND THROAT: No tinnitus or ear pain.  RESPIRATORY: No cough, shortness of breath, wheezing or hemoptysis.  CARDIOVASCULAR: No chest pain, orthopnea, edema.  GASTROINTESTINAL: No nausea, vomiting, diarrhea or abdominal pain.  GENITOURINARY: No dysuria, hematuria.  ENDOCRINE: No polyuria, nocturia,  HEMATOLOGY: No anemia, easy bruising or bleeding SKIN: No rash or lesion. MUSCULOSKELETAL: No joint pain or arthritis.   NEUROLOGIC: No tingling, numbness, weakness.  PSYCHIATRY: No anxiety or depression.   ROS  DRUG ALLERGIES:  No Known Allergies  VITALS:  Blood pressure 99/68, pulse 94, temperature 98.2 F (36.8 C), temperature source Axillary, resp. rate 18, height 5\' 2"  (1.575 m), weight 50.6 kg (111 lb 8.8 oz), SpO2 97 %.  PHYSICAL EXAMINATION:  GENERAL:  57 y.o.-year-old patient lying in the bed with no acute distress.  EYES: Pupils equal, round, reactive to light and accommodation. No scleral icterus. Extraocular muscles intact.  HEENT: Head atraumatic, normocephalic. Oropharynx and nasopharynx clear.  NECK:  Supple, no jugular venous distention. No thyroid enlargement, no tenderness.  LUNGS: Normal breath sounds bilaterally, no wheezing, rales,rhonchi or crepitation. No use of accessory muscles of respiration.  CARDIOVASCULAR: S1, S2 normal. No murmurs, rubs, or gallops.  ABDOMEN: Soft, nontender, nondistended. Bowel sounds present. No organomegaly or mass.  EXTREMITIES: No pedal edema, cyanosis, or clubbing.  NEUROLOGIC: Cranial nerves II through  XII are intact. Muscle strength 5/5 in all extremities. Sensation intact. Gait not checked.  PSYCHIATRIC: The patient is alert and oriented x 3.  SKIN: No obvious rash, lesion, or ulcer.   Physical Exam LABORATORY PANEL:   CBC Recent Labs  Lab 01/12/18 0353  WBC 12.0*  HGB 11.1*  HCT 34.3*  PLT 164   ------------------------------------------------------------------------------------------------------------------  Chemistries  Recent Labs  Lab 01/11/18 0354  01/13/18 0451  NA 142   < > 142  K 4.0   < > 3.7  CL 112*   < > 104  CO2 25   < > 34*  GLUCOSE 159*   < > 110*  BUN 15   < > 11  CREATININE 0.46   < > 0.33*  CALCIUM 7.8*   < > 8.1*  MG 2.7*   < > 1.9  AST 43*  --   --   ALT 26  --   --   ALKPHOS 42  --   --   BILITOT 0.6  --   --    < > = values in this interval not displayed.   ------------------------------------------------------------------------------------------------------------------  Cardiac Enzymes Recent Labs  Lab 01/09/18 2212 01/10/18 1918  TROPONINI <0.03 <0.03   ------------------------------------------------------------------------------------------------------------------  RADIOLOGY:  Dg Chest Port 1 View  Result Date: 01/12/2018 CLINICAL DATA:  Ventilator dependence. EXAM: PORTABLE CHEST 1 VIEW COMPARISON:  01/11/2018 FINDINGS: 0919 hours. Endotracheal tube tip is 2.5 cm above the base of the carina. The NG tube passes into the stomach although the distal tip position is not included on the film. Lungs are hyperexpanded. Interstitial markings are diffusely coarsened with chronic features. The cardiopericardial silhouette is within normal limits for size. Bones are  diffusely demineralized with nonacute posterior right rib fractures. Telemetry leads overlie the chest. IMPRESSION: 1. Stable exam. 2. Emphysema with underlying chronic interstitial disease. Electronically Signed   By: Kennith CenterEric  Mansell M.D.   On: 01/12/2018 09:38    ASSESSMENT AND  PLAN:  *Aspiration pneumonia with acute hypoxic respiratory failure and septic shock Resolving Continue Unasyn, follow-up on cultures, ventilator with weaning as tolerated  *Acute hypoxic respiratory failure Stable on ventilator plan of care as stated above  *Polysubstance abuse Stable Psychiatry following  *Hypertension Antihypertensives currently on hold for septic shock   All the records are reviewed and case discussed with Care Management/Social Workerr. Management plans discussed with the patient, family and they are in agreement.  CODE STATUS: full  TOTAL TIME TAKING CARE OF THIS PATIENT: 35 minutes.     POSSIBLE D/C IN 3-5 DAYS, DEPENDING ON CLINICAL CONDITION.   [name] M.D on 01/13/2018   Between 7am to 6pm - Pager - [phone]  After 6pm go to [email or web address] - Social research officer, governmentpassword EPAS [facility]  [facility] [place] [facility]  Office  [phone]  CC: Primary care physician; Inc., [facility]  Note: This dictation was prepared with Dragon dictation along with smaller phrase technology. Any transcriptional errors that result from this process are unintentional.

## 2018-01-13 NOTE — Plan of Care (Signed)
Sedation stopped at 1135 am this morning; Patient extubated at 1435 by RT, OG removed simultaneously- Dr. Belia HemanKasa aware. Patient to remain NPO at this time. Placed on 2l via [place] and maintaining O2 sats. Other vitals signs stable. Patient bathed this shift; sitter at bedside due to IVC.

## 2018-01-13 NOTE — Progress Notes (Signed)
Pharmacy Electrolyte Monitoring Consult:  Pharmacy consulted to assist in monitoring and replacing electrolytes in this [age] y.o. female admitted on 01/09/2018 with Drug Overdose   Labs:  Sodium (mmol/L)  Date Value  01/13/2018 142   Potassium (mmol/L)  Date Value  01/13/2018 3.7   Magnesium (mg/dL)  Date Value  16/10/960407/11/2017 1.9   Phosphorus (mg/dL)  Date Value  54/09/811907/11/2017 3.1   Calcium (mg/dL)  Date Value  14/78/295607/11/2017 8.1 (L)   Albumin (g/dL)  Date Value  21/30/865707/09/2017 2.6 (L)    Assessment/Plan: No replacement warranted at this time. Will recheck electrolytes with am labs. If no replacement warranted on 7/6, will monitor every 48-72 hours.   Pharmacy will continue to monitor and adjust per consult.   Simpson,Michael L 01/13/2018 3:48 PM

## 2018-01-13 NOTE — Progress Notes (Signed)
CRITICAL CARE NOTE  CC  follow up respiratory failure  SUBJECTIVE Patient remains critically ill Prognosis is guarded On vent Plan for SAT/SBT     BP 99/68   Pulse 94   Temp 98.2 F (36.8 C) (Axillary)   Resp 18   Ht 5' 2"  (1.575 m)   Wt 111 lb 8.8 oz (50.6 kg)   SpO2 97%   BMI 20.40 kg/m    REVIEW OF SYSTEMS  PATIENT IS UNABLE TO PROVIDE COMPLETE REVIEW OF SYSTEM S DUE TO SEVERE CRITICAL ILLNESS AND ENCEPHALOPATHY   PHYSICAL EXAMINATION:  GENERAL:critically ill appearing, +resp distress HEAD: Normocephalic, atraumatic.  EYES: Pupils equal, round, reactive to light.  No scleral icterus.  MOUTH: Moist mucosal membrane. NECK: Supple. No thyromegaly. No nodules. No JVD.  PULMONARY: +rhonchi, +wheezing CARDIOVASCULAR: S1 and S2. Regular rate and rhythm. No murmurs, rubs, or gallops.  GASTROINTESTINAL: Soft, nontender, -distended. No masses. Positive bowel sounds. No hepatosplenomegaly.  MUSCULOSKELETAL: No swelling, clubbing, or edema.  NEUROLOGIC: obtunded, GCS<8 SKIN:intact,warm,dry  ASSESSMENT AND PLAN Admitted for acute resp failure and inability to protect airway Plan for SAT/SBT   Severe Hypoxic and Hypercapnic Respiratory Failure -continue Full MV support -continue Bronchodilator Therapy -Wean Fio2 and PEEP as tolerated -will perform SAT/SBT when respiratory parameters are met   Renal Failure-most likely due to ATN -follow chem 7 -follow UO -continue Foley Catheter-assess need   NEUROLOGY - intubated and sedated - minimal sedation to achieve a RASS goal: -1 Wean off all  sedatives  CARDIAC ICU monitoring    DVT/GI PRX ordered TRANSFUSIONS AS NEEDED MONITOR FSBS ASSESS the need for LABS as needed   Critical Care Time devoted to patient care services described in this note is 34 minutes.     Corrin Parker, M.D.  [facility]  Medical Director [place] Director [facility]  Cardio-Pulmonary Department

## 2018-01-14 ENCOUNTER — Inpatient Hospital Stay: Payer: Medicaid Other

## 2018-01-14 LAB — BASIC METABOLIC PANEL
ANION GAP: 8 (ref 5–15)
BUN: 7 mg/dL (ref 6–20)
CALCIUM: 8.3 mg/dL — AB (ref 8.9–10.3)
CO2: 33 mmol/L — AB (ref 22–32)
Chloride: 99 mmol/L (ref 98–111)
Creatinine, Ser: <0.3 mg/dL — ABNORMAL LOW (ref 0.44–1.00)
GLUCOSE: 91 mg/dL (ref 70–99)
Potassium: 3.5 mmol/L (ref 3.5–5.1)
Sodium: 140 mmol/L (ref 135–145)

## 2018-01-14 LAB — GLUCOSE, CAPILLARY
GLUCOSE-CAPILLARY: 87 mg/dL (ref 70–99)
Glucose-Capillary: 90 mg/dL (ref 70–99)
Glucose-Capillary: 81 mg/dL (ref 70–99)
Glucose-Capillary: 89 mg/dL (ref 70–99)
Glucose-Capillary: 116 mg/dL — ABNORMAL HIGH (ref 70–99)

## 2018-01-14 LAB — AMMONIA: Ammonia: <9 umol/L — ABNORMAL LOW (ref 9–35)

## 2018-01-14 LAB — CBC
HEMATOCRIT: 36.6 % (ref 35.0–47.0)
HEMOGLOBIN: 12.1 g/dL (ref 12.0–16.0)
MCH: 30.6 pg (ref 26.0–34.0)
MCHC: 33.1 g/dL (ref 32.0–36.0)
MCV: 92.6 fL (ref 80.0–100.0)
Platelets: 173 10*3/uL (ref 150–440)
RBC: 3.96 MIL/uL (ref 3.80–5.20)
RDW: 15.9 % — ABNORMAL HIGH (ref 11.5–14.5)
WBC: 7 10*3/uL (ref 3.6–11.0)

## 2018-01-14 LAB — PROCALCITONIN: PROCALCITONIN: 0.32 ng/mL

## 2018-01-14 MED ORDER — MEGESTROL ACETATE 400 MG/10ML PO SUSP
400.0000 mg | Freq: Two times a day (BID) | ORAL | Status: DC
  Administered 2018-01-14 – 2018-01-16 (×4): 400 mg via ORAL
  Filled 2018-01-14 (×5): qty 10

## 2018-01-14 MED ORDER — CLONIDINE HCL 0.1 MG PO TABS
0.1000 mg | ORAL_TABLET | Freq: Two times a day (BID) | ORAL | Status: DC
  Administered 2018-01-14 – 2018-01-16 (×5): 0.1 mg via ORAL
  Filled 2018-01-14 (×5): qty 1

## 2018-01-14 MED ORDER — NICOTINE 14 MG/24HR TD PT24
14.0000 mg | MEDICATED_PATCH | Freq: Every day | TRANSDERMAL | Status: DC
  Administered 2018-01-14 – 2018-01-16 (×3): 14 mg via TRANSDERMAL
  Filled 2018-01-14 (×3): qty 1

## 2018-01-14 MED ORDER — GUAIFENESIN ER 600 MG PO TB12
600.0000 mg | ORAL_TABLET | Freq: Two times a day (BID) | ORAL | Status: DC
  Administered 2018-01-14 – 2018-01-16 (×5): 600 mg via ORAL
  Filled 2018-01-14 (×5): qty 1

## 2018-01-14 MED ORDER — METHYLPREDNISOLONE SODIUM SUCC 125 MG IJ SOLR
60.0000 mg | Freq: Four times a day (QID) | INTRAMUSCULAR | Status: DC
  Administered 2018-01-14 – 2018-01-15 (×4): 60 mg via INTRAVENOUS
  Filled 2018-01-14 (×4): qty 2

## 2018-01-14 MED ORDER — KETOROLAC TROMETHAMINE 15 MG/ML IJ SOLN
15.0000 mg | INTRAMUSCULAR | Status: AC
  Filled 2018-01-14: qty 1

## 2018-01-14 MED ORDER — HYDRALAZINE HCL 20 MG/ML IJ SOLN
10.0000 mg | Freq: Once | INTRAMUSCULAR | Status: AC
  Administered 2018-01-14: 10 mg via INTRAVENOUS
  Filled 2018-01-14: qty 1

## 2018-01-14 MED ORDER — METOPROLOL TARTRATE 25 MG PO TABS
25.0000 mg | ORAL_TABLET | Freq: Two times a day (BID) | ORAL | Status: DC
  Administered 2018-01-14 – 2018-01-16 (×5): 25 mg via ORAL
  Filled 2018-01-14 (×5): qty 1

## 2018-01-14 NOTE — Progress Notes (Signed)
1A RN, Reeves DamJadie updated on patient's BP and medication given for treatment

## 2018-01-14 NOTE — Progress Notes (Signed)
[employer] - [place] at [facility]   PATIENT NAME: Erica Schmidt    MR#:  [id number]  DATE OF BIRTH:  01/30/1961  SUBJECTIVE:  CHIEF COMPLAINT:   Chief Complaint  Patient presents with  . Drug Overdose  , Patient transferred to the floor this morning, patient is brightly awake and alert but confused/disoriented, odd behavior noted, check MRI of the brain for further evaluation, ammonia level, RPR, discontinue Foley/telemetry  REVIEW OF SYSTEMS:  CONSTITUTIONAL: No fever, fatigue or weakness.  EYES: No blurred or double vision.  EARS, NOSE, AND THROAT: No tinnitus or ear pain.  RESPIRATORY: No cough, shortness of breath, wheezing or hemoptysis.  CARDIOVASCULAR: No chest pain, orthopnea, edema.  GASTROINTESTINAL: No nausea, vomiting, diarrhea or abdominal pain.  GENITOURINARY: No dysuria, hematuria.  ENDOCRINE: No polyuria, nocturia,  HEMATOLOGY: No anemia, easy bruising or bleeding SKIN: No rash or lesion. MUSCULOSKELETAL: No joint pain or arthritis.   NEUROLOGIC: No tingling, numbness, weakness.  PSYCHIATRY: No anxiety or depression.   ROS  DRUG ALLERGIES:  No Known Allergies  VITALS:  Blood pressure (!) 174/97, pulse (!) 110, temperature 98.1 F (36.7 C), temperature source Oral, resp. rate 15, height 5\' 2"  (1.575 m), weight 46.3 kg (102 lb 1.2 oz), SpO2 93 %.  PHYSICAL EXAMINATION:  GENERAL:  57 y.o.-year-old patient lying in the bed with no acute distress.  EYES: Pupils equal, round, reactive to light and accommodation. No scleral icterus. Extraocular muscles intact.  HEENT: Head atraumatic, normocephalic. Oropharynx and nasopharynx clear.  NECK:  Supple, no jugular venous distention. No thyroid enlargement, no tenderness.  LUNGS: Normal breath sounds bilaterally, no wheezing, rales,rhonchi or crepitation. No use of accessory muscles of respiration.  CARDIOVASCULAR: S1, S2 normal. No murmurs, rubs, or gallops.  ABDOMEN: Soft, nontender,  nondistended. Bowel sounds present. No organomegaly or mass.  EXTREMITIES: No pedal edema, cyanosis, or clubbing.  NEUROLOGIC: Cranial nerves II through XII are intact. Muscle strength 5/5 in all extremities. Sensation intact. Gait not checked.  PSYCHIATRIC: The patient is alert and oriented x 3.  SKIN: No obvious rash, lesion, or ulcer.   Physical Exam LABORATORY PANEL:   CBC Recent Labs  Lab 01/14/18 0421  WBC 7.0  HGB 12.1  HCT 36.6  PLT 173   ------------------------------------------------------------------------------------------------------------------  Chemistries  Recent Labs  Lab 01/11/18 0354  01/13/18 0451 01/14/18 0421  NA 142   < > 142 140  K 4.0   < > 3.7 3.5  CL 112*   < > 104 99  CO2 25   < > 34* 33*  GLUCOSE 159*   < > 110* 91  BUN 15   < > 11 7  CREATININE 0.46   < > 0.33* <0.30*  CALCIUM 7.8*   < > 8.1* 8.3*  MG 2.7*   < > 1.9  --   AST 43*  --   --   --   ALT 26  --   --   --   ALKPHOS 42  --   --   --   BILITOT 0.6  --   --   --    < > = values in this interval not displayed.   ------------------------------------------------------------------------------------------------------------------  Cardiac Enzymes Recent Labs  Lab 01/09/18 2212 01/10/18 1918  TROPONINI <0.03 <0.03   ------------------------------------------------------------------------------------------------------------------  RADIOLOGY:  No results found.  ASSESSMENT AND PLAN:  *Aspiration pneumonia with acute hypoxic respiratory failure and septic shock Resolving ContinueUnasyn for 7-day course, tracheal aspirate noted for Haemophilus influenza, successfully  extubated, transferred to the floor January 14, 2018, continue to wean O2 off as tolerated  *Acute on COPD exacerbation  IV Solu-Medrol with tapering as tolerated, aggressive pulmonary toilet bronchodilator therapy, mucolytic agents, respiratory therapy following, oxygen weaning as tolerated  *Acute  encephalopathy Most likely secondary to above, compounded by polysubstance abuse Noted odd behavior, confusion with disorientation despite being brightly awake/alert Check MRI of the brain to evaluate for possible stroke, ammonia level, RPR  *Acute on chronic deconditioning, chronic moderate to severe protein calorie/energy malnutrition Check prealbumin, dietary to see, increase nursing care PRN, aspiration/fall precautions while in house, physical therapy to evaluate/treat  *Polysubstance abuse Stable Psychiatry following, continue one-to-one sitter, IVC  *Hypertension Start Lopressor twice daily, IV hydralazine as needed, vitals per routine, make changes as per necessary  *Chronic tobacco smoking abuse/dependency Nicotine patch for cessation counseling ordered  Disposition pending clinical course   All the records are reviewed and case discussed with Care Management/Social Workerr. Management plans discussed with the patient, family and they are in agreement.  CODE STATUS: full  TOTAL TIME TAKING CARE OF THIS PATIENT: 35 minutes.     POSSIBLE D/C IN 1-3 DAYS, DEPENDING ON CLINICAL CONDITION.   Evelena Asa Salary M.D on 01/14/2018   Between 7am to 6pm - Pager - [phone]  After 6pm go to [email or web address] - Social research officer, government  [facility] [place] [facility]  Office  [phone]  CC: Primary care physician; Inc., [facility]  Note: This dictation was prepared with Dragon dictation along with smaller phrase technology. Any transcriptional errors that result from this process are unintentional.

## 2018-01-14 NOTE — Progress Notes (Signed)
Report given to ContinentalJadie, Charity fundraiserN. Patient being transferred to 1A room 149 via bed with sitter.

## 2018-01-14 NOTE — Progress Notes (Signed)
Pharmacy Electrolyte Monitoring Consult:  Pharmacy consulted to assist in monitoring and replacing electrolytes in this 57 y.o. female admitted on 01/09/2018 with Drug Overdose   Labs:  Sodium (mmol/L)  Date Value  01/14/2018 140   Potassium (mmol/L)  Date Value  01/14/2018 3.5   Magnesium (mg/dL)  Date Value  16/10/960407/11/2017 1.9   Phosphorus (mg/dL)  Date Value  54/09/811907/11/2017 3.1   Calcium (mg/dL)  Date Value  14/78/295607/12/2017 8.3 (L)   Albumin (g/dL)  Date Value  21/30/865707/09/2017 2.6 (L)    Assessment/Plan: No replacement warranted at this time.  Will monitor electrolytes every 48-72 hours.   Pharmacy will continue to monitor and adjust per consult.   Stormy CardKatsoudas,Stepheni Cameron K, [facility]PH 01/14/2018 7:41 AM

## 2018-01-14 NOTE — Progress Notes (Signed)
VS stable S/p successful extubation  Ok to transfer to gen med floor    Eather Chaires Santiago Gladavid Toryn Mcclinton, M.D.  [facility]  Medical Director [facility] Medical Director [facility] Cardio-Pulmonary Department

## 2018-01-15 LAB — GLUCOSE, CAPILLARY
GLUCOSE-CAPILLARY: 121 mg/dL — AB (ref 70–99)
GLUCOSE-CAPILLARY: 113 mg/dL — AB (ref 70–99)
GLUCOSE-CAPILLARY: 133 mg/dL — AB (ref 70–99)
Glucose-Capillary: 111 mg/dL — ABNORMAL HIGH (ref 70–99)
Glucose-Capillary: 120 mg/dL — ABNORMAL HIGH (ref 70–99)
Glucose-Capillary: 125 mg/dL — ABNORMAL HIGH (ref 70–99)
Glucose-Capillary: 139 mg/dL — ABNORMAL HIGH (ref 70–99)

## 2018-01-15 LAB — PREALBUMIN: PREALBUMIN: 8.7 mg/dL — AB (ref 18–38)

## 2018-01-15 MED ORDER — IPRATROPIUM-ALBUTEROL 0.5-2.5 (3) MG/3ML IN SOLN
3.0000 mL | Freq: Two times a day (BID) | RESPIRATORY_TRACT | Status: DC
  Administered 2018-01-15 – 2018-01-16 (×2): 3 mL via RESPIRATORY_TRACT
  Filled 2018-01-15 (×2): qty 3

## 2018-01-15 MED ORDER — METHYLPREDNISOLONE SODIUM SUCC 125 MG IJ SOLR
60.0000 mg | Freq: Two times a day (BID) | INTRAMUSCULAR | Status: DC
  Administered 2018-01-15 – 2018-01-16 (×2): 60 mg via INTRAVENOUS
  Filled 2018-01-15 (×2): qty 2

## 2018-01-15 MED ORDER — ENSURE ENLIVE PO LIQD
237.0000 mL | Freq: Three times a day (TID) | ORAL | Status: DC
  Administered 2018-01-15 – 2018-01-16 (×2): 237 mL via ORAL

## 2018-01-15 MED ORDER — ALPRAZOLAM 0.25 MG PO TABS
0.2500 mg | ORAL_TABLET | Freq: Three times a day (TID) | ORAL | Status: DC | PRN
  Administered 2018-01-15 – 2018-01-16 (×3): 0.25 mg via ORAL
  Filled 2018-01-15 (×3): qty 1

## 2018-01-15 MED ORDER — POLYVINYL ALCOHOL 1.4 % OP SOLN
1.0000 [drp] | OPHTHALMIC | Status: DC | PRN
  Administered 2018-01-16: 1 [drp] via OPHTHALMIC
  Filled 2018-01-15: qty 15

## 2018-01-15 MED ORDER — ADULT MULTIVITAMIN W/MINERALS CH
1.0000 | ORAL_TABLET | Freq: Every day | ORAL | Status: DC
  Administered 2018-01-16: 1 via ORAL
  Filled 2018-01-15: qty 1

## 2018-01-15 NOTE — Progress Notes (Signed)
Nutrition Follow-up  DOCUMENTATION CODES:   Severe malnutrition in context of chronic illness  INTERVENTION:  Provide Ensure Enlive po TID, each supplement provides 350 kcal and 20 grams of protein. Patient prefers chocolate flavor.  Will switch patient back to MVI tablet PO daily.  Continue thiamine 097 mg daily and folic acid 1 mg daily.  Can decrease weights to weekly now that patient is off of nutrition support.  NUTRITION DIAGNOSIS:   Severe Malnutrition related to chronic illness, social / environmental circumstances(COPD, polysubstance abuse ) as evidenced by severe fat depletion, severe muscle depletion.  Ongoing - addressing with ONS.  GOAL:   Patient will meet greater than or equal to 90% of their needs  Progressing.  MONITOR:   Diet advancement, Labs, Weight trends, Skin, I & O's  REASON FOR ASSESSMENT:   Consult Assessment of nutrition requirement/status  ASSESSMENT:   57 y/o female with h/o substance abuse, COPD, schizophrenia admitted with overdose   -Rapid response was called on 7/2 as patient became apneic, unresponsive, and was unable to protect airway. She was emergently intubated. Excessive secretions were noted from ETT. -Patient was extubated on 7/5 and OGT was removed. -Transferred to 1A on 7/6. -Diet was advanced to clear liquids on AM of 7/6 and then regular that afternoon.  Met with patient at bedside. Safety sitter present. Patient reports her appetite is okay. She had an omelette for breakfast and chicken for lunch. She denies any N/V or abdominal pain. Reports diarrhea though no BM characteristics have been documented in chart. She is edentulous and does not have any dentures. She denies any issues with chewing, though, and does not want diet downgraded. She is amenable to drinking Ensure to help meet calorie/protein needs and prefers the chocolate flavor. Denies any food allergies or intolerances.  Meal Completion: 0-20% while patient was on  clear liquids; has been 50-100% since being on regular  Medications reviewed and include: famotidine, folic acid 1 mg daily, Novolog 0-9 units Q4hrs, Megace 400 mg BID, Solu-Medrol 60 mg Q12hrs IV, liquid MVI daily per tube, thiamine 100 mg daily, Unasyn.  Labs reviewed: CBG 81-125. On 7/6 CO2 33, Creatinine <0.3.  I/O: 1250 mL UOP yesterday (1.3 mL/kg/hr) + one unmeasured urine occurrence   Weight trend: 41.6 kg on 7/7; -1.2 kg from admission  Discussed with RN.  Diet Order:   Diet Order           Diet regular Room service appropriate? Yes; Fluid consistency: Thin  Diet effective now          EDUCATION NEEDS:   Not appropriate for education at this time  Skin:  Skin Assessment: Reviewed RN Assessment  Last BM:  01/12/2018 - BM characteristics not documented  Height:   Ht Readings from Last 1 Encounters:  01/10/18 _0  (1.575 m)    Weight:   Wt Readings from Last 1 Encounters:  01/15/18 91 lb 11.4 oz (41.6 kg)    Ideal Body Weight:  50 kg  BMI:  Body mass index is 16.77 kg/m.  Estimated Nutritional Needs:   Kcal:  1300-1500 (30-35 kcal/kg)  Protein:  60-75 grams (1.5-1.8 grams/kg)  Fluid:  1.3-1.5 L/day (30-35 mL/kg)  Willey Blade, MS, RD, LDN Office: [phone] Pager: [phone] After Hours/Weekend Pager: [phone]
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
## 2018-01-15 NOTE — Progress Notes (Signed)
[facility] - [place] [facility]   PATIENT NAME: Erica Schmidt    MR#:  [id number]  DATE OF BIRTH:  1960/08/22  SUBJECTIVE:   Patient here to a drug overdose and was intubated due to aspiration pneumonia.  Now extubated.  Respiratory status stable.  Still seem somewhat confused.  REVIEW OF SYSTEMS:    Review of Systems  Constitutional: Negative for chills and fever.  HENT: Negative for congestion and tinnitus.   Eyes: Negative for blurred vision and double vision.  Respiratory: Negative for cough, shortness of breath and wheezing.   Cardiovascular: Negative for chest pain, orthopnea and PND.  Gastrointestinal: Negative for abdominal pain, diarrhea, nausea and vomiting.  Genitourinary: Negative for dysuria and hematuria.  Neurological: Negative for dizziness, sensory change and focal weakness.  All other systems reviewed and are negative.   Nutrition: Regular Tolerating Diet: Yes Tolerating PT: Await Eval.      DRUG ALLERGIES:  No Known Allergies  VITALS:  Blood pressure 136/87, pulse 87, temperature 98 F (36.7 C), temperature source Oral, resp. rate 20, height 5\' 2"  (1.575 m), weight 41.6 kg (91 lb 11.4 oz), SpO2 96 %.  PHYSICAL EXAMINATION:   Physical Exam  GENERAL:  57 y.o.-year-old patient lying in bed in no acute distress.  EYES: Pupils equal, round, reactive to light and accommodation. No scleral icterus. Extraocular muscles intact.  HEENT: Head atraumatic, normocephalic. Oropharynx and nasopharynx clear.  NECK:  Supple, no jugular venous distention. No thyroid enlargement, no tenderness.  LUNGS: Normal breath sounds bilaterally, no wheezing, rales, rhonchi. No use of accessory muscles of respiration.  CARDIOVASCULAR: S1, S2 normal. No murmurs, rubs, or gallops.  ABDOMEN: Soft, nontender, nondistended. Bowel sounds present. No organomegaly or mass.  EXTREMITIES: No cyanosis, clubbing or edema b/l.    NEUROLOGIC: Cranial nerves II through  XII are intact. No focal Motor or sensory deficits b/l.   PSYCHIATRIC: The patient is alert and oriented x 1.  SKIN: No obvious rash, lesion, or ulcer.    LABORATORY PANEL:   CBC Recent Labs  Lab 01/14/18 0421  WBC 7.0  HGB 12.1  HCT 36.6  PLT 173   ------------------------------------------------------------------------------------------------------------------  Chemistries  Recent Labs  Lab 01/11/18 0354  01/13/18 0451 01/14/18 0421  NA 142   < > 142 140  K 4.0   < > 3.7 3.5  CL 112*   < > 104 99  CO2 25   < > 34* 33*  GLUCOSE 159*   < > 110* 91  BUN 15   < > 11 7  CREATININE 0.46   < > 0.33* <0.30*  CALCIUM 7.8*   < > 8.1* 8.3*  MG 2.7*   < > 1.9  --   AST 43*  --   --   --   ALT 26  --   --   --   ALKPHOS 42  --   --   --   BILITOT 0.6  --   --   --    < > = values in this interval not displayed.   ------------------------------------------------------------------------------------------------------------------  Cardiac Enzymes Recent Labs  Lab 01/10/18 1918  TROPONINI <0.03   ------------------------------------------------------------------------------------------------------------------  RADIOLOGY:  Mr Brain Wo Contrast  Result Date: 01/14/2018 CLINICAL DATA:  Encephalopathy.  Drug overdose. EXAM: MRI HEAD WITHOUT CONTRAST TECHNIQUE: Multiplanar, multiecho pulse sequences of the brain and surrounding structures were obtained without intravenous contrast. COMPARISON:  Head CT 01/09/2018 FINDINGS: The patient terminated the examination prematurely. Axial and coronal  diffusion, sagittal T1, axial FLAIR, and axial T2 sequences were obtained and are mildly motion degraded. Brain: There is no evidence of acute infarct, intracranial hemorrhage, mass, midline shift, or extra-axial fluid collection. Scattered T2 hyperintensities in the subcortical and deep cerebral white matter and pons are nonspecific but compatible with mild chronic small vessel ischemic disease.  The ventricles and sulci are normal. Vascular: Major intracranial vascular flow voids are preserved. Skull and upper cervical spine: No suspicious marrow lesion. Sinuses/Orbits: Unremarkable orbits. Moderate circumferential left sphenoid sinus mucosal thickening. Small bilateral mastoid effusions. Other: None. IMPRESSION: 1. Incomplete examination. No acute infarct or other acute intracranial abnormality identified. 2. Mild chronic small vessel ischemic disease. Electronically Signed   By: Sebastian AcheAllen  Grady M.D.   On: 01/14/2018 15:28     ASSESSMENT AND PLAN:   [age] year old female with past medical history of substance abuse, COPD who was admitted to the hospital due to drug overdose and altered mental status.  1.  Acute encephalopathy-secondary to drug overdose. - Patient was initially intubated, but now extubated.  Metabolic work-up has so far been negative.  Patient still seems somewhat confused.  MRI obtained yesterday which was negative for acute pathology. - Patient still has a one-to-one sitter, await psychiatric input for further discontinuing IVC.  2.  COPD exacerbation- secondary to aspiration pneumonia. -Continue IV steroids, will taper as she is improving.  Continue Pulmicort nebs, duo nebs. - Continue empiric antibiotics for the aspiration pneumonia.  3.  Aspiration pneumonia-continue IV Unasyn.  Clinically improving.  4.  Essential hypertension- continue metoprolol, clonidine.  5.  Tobacco abuse-continue nicotine patch.     All the records are reviewed and case discussed with Care Management/Social Worker. Management plans discussed with the patient, family and they are in agreement.  CODE STATUS: Full code  DVT Prophylaxis: Lovenox  TOTAL TIME TAKING CARE OF THIS PATIENT: 30 minutes.   POSSIBLE D/C IN 1-2 DAYS, DEPENDING ON CLINICAL CONDITION.   Houston SirenSAINANI,VIVEK J M.D on 01/15/2018 at 12:52 PM  Between 7am to 6pm - Pager - [phone]  After 6pm go to [email or web address] -  Therapist, nutritionalpassword EPAS [facility]  [facility] [place] [facility]  Office  [phone]  CC: Primary care physician; [facility] [address] [facility]

## 2018-01-15 NOTE — Plan of Care (Signed)

## 2018-01-15 NOTE — Evaluation (Signed)
Physical Therapy Evaluation Patient Details Name: Erica RilyDeborah L Schmidt MRN: [id number] DOB: Jan 02, 1961 Today's Date: 01/15/2018   History of Present Illness  57 yo Female came to ED 01/09/18 unresponsive with suspected overdose. She has history of drug abuse in the past. While in the hospital she was found to be apneic, high BP and was transferred to ICU on ventilator; She has since been transferred to regular room on 1L O2; PMH significant: COPD, emphysema  Clinical Impression  57 yo Female s/p recent overdose, presents with slight confusion. Patient was living with a friend in a mobile home. She states that she is unsure where she will live when discharged. She is currently mod I for bed mobility. She does require supervision for sit<>Stand transfers for safety as patient is often impulsive. She ambulated 100 feet on room air. Patient exhibits fair dynamic standing balance, requiring CGA for safety during ambulation. Following gait, her SPo2 dropped to 83%. PT re-applied 1L O2 nasal cannula and instructed patient in pursed lip breathing. Her Spo2 rebounded to 91% within 30 sec. Patient does exhibit LE weakness grossly 3+/5. Patient would benefit from additional skilled PT intervention to improve strength, balance and gait safety;     Follow Up Recommendations Home health PT;Other (comment)(would benefit from outpatient PT, but patient does not have transportation; )    Equipment Recommendations  None recommended by PT    Recommendations for Other Services Rehab consult     Precautions / Restrictions Precautions Precautions: Fall Restrictions Weight Bearing Restrictions: No      Mobility  Bed Mobility Overal bed mobility: Modified Independent             General bed mobility comments: uses bed rail, elevated head of bed;   Transfers Overall transfer level: Needs assistance Equipment used: None Transfers: Sit to/from Stand Sit to Stand: Supervision         General transfer  comment: supervision for safety; patient can be impulsive;   Ambulation/Gait Ambulation/Gait assistance: Min guard Gait Distance (Feet): 100 Feet Assistive device: None Gait Pattern/deviations: Step-through pattern;Decreased step length - left;Decreased step length - right;Decreased dorsiflexion - left;Decreased dorsiflexion - right;Narrow base of support Gait velocity: decreased Gait velocity interpretation: 1.31 - 2.62 ft/sec, indicative of limited community ambulator General Gait Details: slower gait speed (2 feet per sec), slight veering side/side, inconsistent;   Stairs            Wheelchair Mobility    Modified Rankin (Stroke Patients Only)       Balance Overall balance assessment: Needs assistance   Sitting balance-Leahy Scale: Good       Standing balance-Leahy Scale: Fair Standing balance comment: requires CGA for safety especially with turns/gait;                              Pertinent Vitals/Pain Pain Assessment: No/denies pain    Home Living Family/patient expects to be discharged to:: Other (Comment)(pt unsure; she was living with a friend; but not sure where she will go when discharged; ) Living Arrangements: Other (Comment)(friend) Available Help at Discharge: Other (Comment)(unsure) Type of Home: Mobile home Home Access: Level entry     Home Layout: One level Home Equipment: None      Prior Function Level of Independence: Independent               Hand Dominance        Extremity/Trunk Assessment   Upper Extremity Assessment Upper Extremity  Assessment: Overall WFL for tasks assessed    Lower Extremity Assessment Lower Extremity Assessment: RLE deficits/detail;LLE deficits/detail RLE Deficits / Details: strength grossly 3+/5 RLE Sensation: WNL LLE Deficits / Details: strength grossly 3+/5 LLE Sensation: WNL    Cervical / Trunk Assessment Cervical / Trunk Assessment: Normal  Communication   Communication: No  difficulties  Cognition Arousal/Alertness: Awake/alert Behavior During Therapy: Flat affect Overall Cognitive Status: Difficult to assess                                 General Comments: oriented to place, date, person; takes longer to answer questions and often confused; unsure of memory at baseline;       General Comments General comments (skin integrity, edema, etc.): no swelling noted; intact skin;     Exercises Other Exercises Other Exercises: Instructed patient in LE strengthening exercise, Long sitting: ankle pump x10, SLR flexion x10, Hip abduction/adduction to midline x10, all bilaterally with mod VCs for correct exercise technique; wrote down exercise, utilized teach back for understanding; patient continues to require prompting for HEP;    Assessment/Plan    PT Assessment Patient needs continued PT services  PT Problem List Decreased strength;Decreased activity tolerance;Decreased balance;Decreased mobility;Decreased safety awareness;Cardiopulmonary status limiting activity       PT Treatment Interventions Gait training;Stair training;Functional mobility training;Neuromuscular re-education;Balance training;Therapeutic exercise;Therapeutic activities;Patient/family education    PT Goals (Current goals can be found in the Care Plan section)  Acute Rehab PT Goals Patient Stated Goal: to get better PT Goal Formulation: With patient Time For Goal Achievement: 01/29/18 Potential to Achieve Goals: Good    Frequency Min 2X/week   Barriers to discharge Other (comment) pt unsure where she will go when discharged; may be able to live with her daughter Joice Lofts but unsure;     Co-evaluation               AM-PAC PT "6 Clicks" Daily Activity  Outcome Measure Difficulty turning over in bed (including adjusting bedclothes, sheets and blankets)?: None Difficulty moving from lying on back to sitting on the side of the bed? : A Little Difficulty sitting down on  and standing up from a chair with arms (e.g., wheelchair, bedside commode, etc,.)?: None Help needed moving to and from a bed to chair (including a wheelchair)?: A Little Help needed walking in hospital room?: A Little Help needed climbing 3-5 steps with a railing? : A Little 6 Click Score: 20    End of Session Equipment Utilized During Treatment: Gait belt Activity Tolerance: Patient tolerated treatment well;No increased pain Patient left: in chair;with chair alarm set;with nursing/sitter in room;with call bell/phone within reach Nurse Communication: Mobility status;Other (comment)(drop in SPO2) PT Visit Diagnosis: Unsteadiness on feet (R26.81);Muscle weakness (generalized) (M62.81)    Time: 1610-9604 PT Time Calculation (min) (ACUTE ONLY): 20 min   Charges:   PT Evaluation $PT Eval Low Complexity: 1 Low PT Treatments $Therapeutic Exercise: 8-22 mins   PT G Codes:          Trotter,Margaret PT, DPT 01/15/2018, 10:29 AM

## 2018-01-16 DIAGNOSIS — F32A Depression, unspecified: Secondary | ICD-10-CM

## 2018-01-16 DIAGNOSIS — F329 Major depressive disorder, single episode, unspecified: Secondary | ICD-10-CM

## 2018-01-16 LAB — BASIC METABOLIC PANEL
Anion gap: 9 (ref 5–15)
BUN: 23 mg/dL — AB (ref 6–20)
CHLORIDE: 100 mmol/L (ref 98–111)
CO2: 31 mmol/L (ref 22–32)
CREATININE: 0.49 mg/dL (ref 0.44–1.00)
Calcium: 8.7 mg/dL — ABNORMAL LOW (ref 8.9–10.3)
GFR calc Af Amer: >60 mL/min (ref >60)
GFR calc non Af Amer: >60 mL/min (ref >60)
Glucose, Bld: 150 mg/dL — ABNORMAL HIGH (ref 70–99)
POTASSIUM: 3.6 mmol/L (ref 3.5–5.1)
SODIUM: 140 mmol/L (ref 135–145)

## 2018-01-16 LAB — GLUCOSE, CAPILLARY
GLUCOSE-CAPILLARY: 157 mg/dL — AB (ref 70–99)
GLUCOSE-CAPILLARY: 123 mg/dL — AB (ref 70–99)
GLUCOSE-CAPILLARY: 184 mg/dL — AB (ref 70–99)
GLUCOSE-CAPILLARY: 100 mg/dL — AB (ref 70–99)

## 2018-01-16 LAB — PHOSPHORUS: PHOSPHORUS: 3.7 mg/dL (ref 2.5–4.6)

## 2018-01-16 LAB — MAGNESIUM: MAGNESIUM: 1.9 mg/dL (ref 1.7–2.4)

## 2018-01-16 LAB — RPR: RPR: NONREACTIVE

## 2018-01-16 MED ORDER — CLONIDINE HCL 0.1 MG PO TABS: 0.1000 mg | ORAL_TABLET | Freq: Two times a day (BID) | ORAL | 1 refills | Status: AC

## 2018-01-16 MED ORDER — AMOXICILLIN-POT CLAVULANATE 875-125 MG PO TABS: 1.0000 | ORAL_TABLET | Freq: Two times a day (BID) | ORAL | 0 refills | Status: AC

## 2018-01-16 MED ORDER — PREDNISONE 10 MG PO TABS: ORAL_TABLET | ORAL | 0 refills | Status: AC

## 2018-01-16 MED ORDER — ALBUTEROL SULFATE HFA 108 (90 BASE) MCG/ACT IN AERS: 1.0000 | INHALATION_SPRAY | Freq: Four times a day (QID) | RESPIRATORY_TRACT | 0 refills | Status: AC | PRN

## 2018-01-16 MED ORDER — MIRTAZAPINE 30 MG PO TABS: 30.0000 mg | ORAL_TABLET | Freq: Every day | ORAL | 1 refills | Status: AC

## 2018-01-16 MED ORDER — ENOXAPARIN SODIUM 30 MG/0.3ML ~~LOC~~ SOLN
30.0000 mg | SUBCUTANEOUS | Status: DC
  Administered 2018-01-16: 30 mg via SUBCUTANEOUS
  Filled 2018-01-16: qty 0.3

## 2018-01-16 MED ORDER — BUDESONIDE-FORMOTEROL FUMARATE 160-4.5 MCG/ACT IN AERO: 2.0000 | INHALATION_SPRAY | Freq: Two times a day (BID) | RESPIRATORY_TRACT | 1 refills | Status: AC

## 2018-01-16 MED ORDER — MIRTAZAPINE 15 MG PO TABS: 30.0000 mg | ORAL_TABLET | Freq: Every day | ORAL | Status: DC

## 2018-01-16 NOTE — Care Management (Signed)
Patient does not require walker; walker not delivered. Patient is independent with mobility.

## 2018-01-16 NOTE — Discharge Summary (Signed)
[employer] - [facility]   PATIENT NAME: Erica Schmidt    MR#:  [id number]  DATE OF BIRTH:  [birth year]  DATE OF ADMISSION:  01/09/2018 ADMITTING PHYSICIAN: Oralia Manis, MD  DATE OF DISCHARGE: 01/16/2018  PRIMARY CARE PHYSICIAN: [facility]    ADMISSION DIAGNOSIS:  Drug overdose, undetermined intent, initial encounter [T50.904A] Altered mental status, unspecified altered mental status type [R41.82]  DISCHARGE DIAGNOSIS:  Principal Problem:   Overdose, undetermined intent, initial encounter Active Problems:   COPD (chronic obstructive pulmonary disease) (HCC)   HTN (hypertension)   Alcohol abuse   Benzodiazepine abuse (HCC)   Protein-calorie malnutrition, severe   Chronic depression   SECONDARY DIAGNOSIS:   Past Medical History:  Diagnosis Date  . COPD (chronic obstructive pulmonary disease) (HCC)   . Emphysema of lung (HCC)   . Polysubstance abuse [facility])     HOSPITAL COURSE:    57 year old female with past medical history of substance abuse, COPD who was admitted to the hospital due to drug overdose and altered mental status.  1.  Acute encephalopathy-secondary to drug overdose. - Patient was initially intubated, but now extubated.  Metabolic work-up was negative.    Continue to be somewhat confused and therefore MRI of the brain obtained which was negative for acute pathology. - Patient was under involuntary commitment on admission due to drug overdose but now her commitment has been lifted.  Patient as per psychiatry is stable to be discharged home.  She will be discharged on a small dose of antidepressant.  She was started on Remeron.  2.  COPD exacerbation- secondary to aspiration pneumonia. -Patient was treated with IV steroids, duo nebs, Pulmicort nebs.  She has improved.  She is now being discharged on oral prednisone taper and has been started on some Symbicort and albuterol inhaler as needed. -She will be also  discharged on oral Augmentin for additional 3 days.  She will finish a total of a 10-day course of antibiotics, while in the hospital she was on IV Unasyn.\ -Patient did qualify for home oxygen which is being arranged for her prior to discharge.  3.  Aspiration pneumonia- she was treated with IV Unasyn, not being discharged on Augmentin.  She will finished a 10-day course.    4.  Essential hypertension-  she had no previous history of hypertension but her blood pressures are somewhat high while in the hospital.  She is being discharged on some low-dose clonidine.    DISCHARGE CONDITIONS:   Stable  CONSULTS OBTAINED:  Treatment Team:  Shari Prows, MD Clapacs, Jackquline Denmark, MD  DRUG ALLERGIES:  No Known Allergies  DISCHARGE MEDICATIONS:   Allergies as of 01/16/2018   No Known Allergies     Medication List    STOP taking these medications   AEROCHAMBER PLUS inhaler   FIRST-DUKES MOUTHWASH Susp   lidocaine 2 % solution Commonly known as:  XYLOCAINE     TAKE these medications   albuterol 108 (90 Base) MCG/ACT inhaler Commonly known as:  PROVENTIL HFA;VENTOLIN HFA Inhale 1-2 puffs into the lungs every 6 (six) hours as needed for wheezing or shortness of breath.   amoxicillin-clavulanate 875-125 MG tablet Commonly known as:  AUGMENTIN Take 1 tablet by mouth 2 (two) times daily for 3 days.   budesonide-formoterol 160-4.5 MCG/ACT inhaler Commonly known as:  SYMBICORT Inhale 2 puffs into the lungs 2 (two) times daily.   cloNIDine 0.1 MG tablet Commonly known as:  CATAPRES Take 1  tablet (0.1 mg total) by mouth 2 (two) times daily.   mirtazapine 30 MG tablet Commonly known as:  REMERON Take 1 tablet (30 mg total) by mouth at bedtime.   predniSONE 10 MG tablet Commonly known as:  DELTASONE Label  & dispense according to the schedule below. 5 Pills PO for 1 day then, 4 Pills PO for 1 day, 3 Pills PO for 1 day, 2 Pills PO for 1 day, 1 Pill PO for 1 days then STOP.             Durable Medical Equipment  (From admission, onward)        Start     Ordered   01/16/18 1404  For home use only DME Walker rolling  Once    Question:  Patient needs a walker to treat with the following condition  Answer:  Difficulty walking   01/16/18 1406   01/16/18 1354  For home use only DME oxygen  Once    Question Answer Comment  Mode or (Route) Nasal cannula   Liters per Minute 2   Frequency Continuous (stationary and portable oxygen unit needed)   Oxygen conserving device Yes   Oxygen delivery system Gas      01/16/18 1354        DISCHARGE INSTRUCTIONS:   DIET:  Cardiac diet  DISCHARGE CONDITION:  Stable  ACTIVITY:  Activity as tolerated  OXYGEN:  Home Oxygen: No.   Oxygen Delivery: room air  DISCHARGE LOCATION:  home   If you experience worsening of your admission symptoms, develop shortness of breath, life threatening emergency, suicidal or homicidal thoughts you must seek medical attention immediately by calling 911 or calling your MD immediately  if symptoms less severe.  You Must read complete instructions/literature along with all the possible adverse reactions/side effects for all the Medicines you take and that have been prescribed to you. Take any new Medicines after you have completely understood and accpet all the possible adverse reactions/side effects.   Please note  You were cared for by a hospitalist during your hospital stay. If you have any questions about your discharge medications or the care you received while you were in the hospital after you are discharged, you can call the unit and asked to speak with the hospitalist on call if the hospitalist that took care of you is not available. Once you are discharged, your primary care physician will handle any further medical issues. Please note that NO REFILLS for any discharge medications will be authorized once you are discharged, as it is imperative that you return to your  primary care physician (or establish a relationship with a primary care physician if you do not have one) for your aftercare needs so that they can reassess your need for medications and monitor your lab values.     Today   Shortness of breath improved, patient did qualify for home oxygen which is being arranged for her.  IVC has been lifted.  Will discharge home later today.  VITAL SIGNS:  Blood pressure (!) 134/93, pulse 64, temperature 97.7 F (36.5 C), temperature source Oral, resp. rate 18, height 5\' 2"  (1.575 m), weight 41.2 kg (90 lb 13.3 oz), SpO2 92 %.  I/O:    Intake/Output Summary (Last 24 hours) at 01/16/2018 1418 Last data filed at 01/16/2018 1350 Gross per 24 hour  Intake 1930 ml  Output -  Net 1930 ml    PHYSICAL EXAMINATION:   GENERAL:  57 y.o.-year-old patient lying in  bed in no acute distress.  EYES: Pupils equal, round, reactive to light and accommodation. No scleral icterus. Extraocular muscles intact.  HEENT: Head atraumatic, normocephalic. Oropharynx and nasopharynx clear.  NECK:  Supple, no jugular venous distention. No thyroid enlargement, no tenderness.  LUNGS: Normal breath sounds bilaterally, no wheezing, rales, rhonchi. No use of accessory muscles of respiration.  CARDIOVASCULAR: S1, S2 normal. No murmurs, rubs, or gallops.  ABDOMEN: Soft, nontender, nondistended. Bowel sounds present. No organomegaly or mass.  EXTREMITIES: No cyanosis, clubbing or edema b/l.    NEUROLOGIC: Cranial nerves II through XII are intact. No focal Motor or sensory deficits b/l.   PSYCHIATRIC: The patient is alert and oriented x 3.  SKIN: No obvious rash, lesion, or ulcer.    DATA REVIEW:   CBC Recent Labs  Lab 01/14/18 0421  WBC 7.0  HGB 12.1  HCT 36.6  PLT 173    Chemistries  Recent Labs  Lab 01/11/18 0354  01/16/18 0502  NA 142   < > 140  K 4.0   < > 3.6  CL 112*   < > 100  CO2 25   < > 31  GLUCOSE 159*   < > 150*  BUN 15   < > 23*  CREATININE 0.46   <  > 0.49  CALCIUM 7.8*   < > 8.7*  MG 2.7*   < > 1.9  AST 43*  --   --   ALT 26  --   --   ALKPHOS 42  --   --   BILITOT 0.6  --   --    < > = values in this interval not displayed.    Cardiac Enzymes Recent Labs  Lab 01/10/18 1918  TROPONINI <0.03    Microbiology Results  Results for orders placed or performed during the hospital encounter of 01/09/18  Culture, respiratory (NON-Expectorated)     Status: None   Collection Time: 01/10/18  6:30 PM  Result Value Ref Range Status   Specimen Description   Final    TRACHEAL ASPIRATE Performed at [facility], [address] [place], [place] [address]    Special Requests   Final    NONE Performed at [facility], [address] [place], [place] [address]    Gram Stain   Final    ABUNDANT WBC PRESENT, PREDOMINANTLY PMN FEW GRAM POSITIVE COCCI FEW GRAM POSITIVE RODS    Culture   Final    ABUNDANT HAEMOPHILUS INFLUENZAE BETA LACTAMASE NEGATIVE Performed at [facility], [address] [place], [place] [address]    Report Status 01/13/2018 FINAL  Final  MRSA PCR Screening     Status: None   Collection Time: 01/10/18  7:44 PM  Result Value Ref Range Status   MRSA by PCR NEGATIVE NEGATIVE Final    Comment:        The GeneXpert MRSA Assay (FDA approved for NASAL specimens only), is one component of a comprehensive MRSA colonization surveillance program. It is not intended to diagnose MRSA infection nor to guide or monitor treatment for MRSA infections. Performed at [facility], [address] [place], [place] [address]   Culture, respiratory (NON-Expectorated)     Status: None   Collection Time: 01/11/18 12:37 AM  Result Value Ref Range Status   Specimen Description   Final    TRACHEAL ASPIRATE Performed at [facility], [address] [place], [place] [address]    Special Requests  Final    NONE Performed at [facility], [address]  [place], [place] [address]    Gram Stain   Final    FEW WBC PRESENT, PREDOMINANTLY PMN RARE GRAM POSITIVE RODS    Culture   Final    ABUNDANT HAEMOPHILUS INFLUENZAE BETA LACTAMASE NEGATIVE Performed at [facility], [address] [place], [place] [address]    Report Status 01/13/2018 FINAL  Final    RADIOLOGY:  Mr Brain Wo Contrast  Result Date: 01/14/2018 CLINICAL DATA:  Encephalopathy.  Drug overdose. EXAM: MRI HEAD WITHOUT CONTRAST TECHNIQUE: Multiplanar, multiecho pulse sequences of the brain and surrounding structures were obtained without intravenous contrast. COMPARISON:  Head CT 01/09/2018 FINDINGS: The patient terminated the examination prematurely. Axial and coronal diffusion, sagittal T1, axial FLAIR, and axial T2 sequences were obtained and are mildly motion degraded. Brain: There is no evidence of acute infarct, intracranial hemorrhage, mass, midline shift, or extra-axial fluid collection. Scattered T2 hyperintensities in the subcortical and deep cerebral white matter and pons are nonspecific but compatible with mild chronic small vessel ischemic disease. The ventricles and sulci are normal. Vascular: Major intracranial vascular flow voids are preserved. Skull and upper cervical spine: No suspicious marrow lesion. Sinuses/Orbits: Unremarkable orbits. Moderate circumferential left sphenoid sinus mucosal thickening. Small bilateral mastoid effusions. Other: None. IMPRESSION: 1. Incomplete examination. No acute infarct or other acute intracranial abnormality identified. 2. Mild chronic small vessel ischemic disease. Electronically Signed   By: Sebastian Ache M.D.   On: 01/14/2018 15:28      Management plans discussed with the patient, family and they are in agreement.  CODE STATUS:     Code Status Orders  (From admission, onward)        Start     Ordered   01/10/18 0125  Full code  Continuous     01/10/18 0124    Code Status History    This patient has a current code  status but no historical code status.      TOTAL TIME TAKING CARE OF THIS PATIENT: 40 minutes.    Houston Siren M.D on 01/16/2018 at 2:18 PM  Between 7am to 6pm - Pager - [phone]  After 6pm go to [email or web address] - Therapist, nutritional Hospitalists  Office  [phone]  CC: Primary care physician; [facility]

## 2018-01-16 NOTE — Care Management (Addendum)
RNCM met with patient to discuss transition of care to home today.  [facility] O[facility]. Patient is independent with mobility while I was in the room with her.  She states she lives with her daughter and address is not correct in system per patient. Patient doesn't know daughters address.  Referal to [facility] as patient did not have preference of agencies. Message left for daughter Luetta Nutting to call this RNCM with address. Rolling walker requested also from [facility]. Update at 1432: Follow up as new patient with Medicaid list facility of [facility] NP on July 12 at 1245P.  Address per daughter is [place] [address] contact [phone].  [facility] cannot accept patient for [facility] with [facility] Address.  [facility] cannot accept either. Message sent to [facility] and [facility] to see if they can accept. Daughter said she could be here by ~2 hours- waiting on husband to get home to babysit so she don't have to bring her children to hospital. [facility] and [facility] cannot provide services. [facility] is reviewing. [facility] can see patient- [facility] will be Monday due to nursing shortage.

## 2018-01-16 NOTE — Progress Notes (Signed)
Pharmacy Lovenox Dosing  57 y.o. female admitted with Drug Overdose . Patient ordered Lovenox 40 mg daily for VTE prophylaxis.   Filed Weights   01/14/18 0400 01/15/18 0348 01/16/18 0452  Weight: 102 lb 1.2 oz (46.3 kg) 91 lb 11.4 oz (41.6 kg) 90 lb 13.3 oz (41.2 kg)    Body mass index is 16.61 kg/m.  Estimated Creatinine Clearance: 49.9 mL/min (by C-G formula based on SCr of 0.49 mg/dL).  Will adjust Lovenox dosing to 30 mg daily for weight < 45 kg.   Luisa HartChristy, Iliyah Bui D 01/16/2018 7:26 AM

## 2018-01-16 NOTE — Progress Notes (Signed)
SATURATION QUALIFICATIONS: (This note is used to comply with regulatory documentation for home oxygen)  Patient Saturations on Room Air at Rest = 91%  Patient Saturations on Room Air while Ambulating = 82%  Patient Saturations on 2 Liters of oxygen while Ambulating = 93%  Please briefly explain why patient needs home oxygen: Pt oxygen saturation drops to 82% while ambulating on room air. Pt needs oxygen to maintain adequate oxygen levels.

## 2018-01-16 NOTE — Progress Notes (Addendum)
Pharmacy Electrolyte Monitoring Consult:  Pharmacy consulted to assist in monitoring and replacing electrolytes in this 57 y.o. female admitted on 01/09/2018 with Drug Overdose   Labs:  Sodium (mmol/L)  Date Value  01/16/2018 140   Potassium (mmol/L)  Date Value  01/16/2018 3.6   Magnesium (mg/dL)  Date Value  40/98/119107/11/2017 1.9   Phosphorus (mg/dL)  Date Value  47/82/956207/11/2017 3.1   Calcium (mg/dL)  Date Value  13/08/657807/02/2018 8.7 (L)   Albumin (g/dL)  Date Value  46/96/295207/09/2017 2.6 (L)    Assessment/Plan: Mg and phos added to AM labs and are WNL. Will continue to monitor daily labs for now as patient due to severe malnutrition.   Erica Schmidt, Erica Schmidt D, [facility] 01/16/2018 7:27 AM

## 2018-01-16 NOTE — Consult Note (Signed)
[facility] Face-to-Face Psychiatry Consult   Reason for Consult: Consult for this 57 year old woman who has been in the hospital for several days initially on the intensive care unit on a ventilator. Referring Physician: Verdell Carmine Patient Identification: Erica Schmidt MRN:  [id number] Principal Diagnosis: Overdose, undetermined intent, initial encounter Diagnosis:   Patient Active Problem List   Diagnosis Date Noted  . Chronic depression [F32.9] 01/16/2018  . Protein-calorie malnutrition, severe [E43] 01/11/2018  . Alcohol abuse [F10.10] 01/10/2018  . Benzodiazepine abuse ([place]) [F13.10] 01/10/2018  . Overdose, undetermined intent, initial encounter [T50.904A] 01/09/2018  . COPD (chronic obstructive pulmonary disease) ([place]) [J44.9] 01/09/2018  . HTN (hypertension) [I10] 01/09/2018    Total Time spent with patient: 1 hour  Subjective:   Erica Schmidt is a 57 y.o. female patient admitted with "they say I overdosed".  HPI: Patient interviewed chart reviewed.  57 year old woman with a history of alcohol and drug abuse came into the hospital with respiratory failure.  Alcohol level was elevated drug screen positive for benzodiazepines.  Patient was in the intensive care unit on a ventilator for several days but has now been extubated for more than a day and is being medically prepared for discharge.  On interview today the patient does not remember the events that brought her to the hospital.  She vaguely admits that she might of been drinking but does not remember how much.  She thinks that she only relapsed for about a day.  She denies having any memory of taking any pills or other drugs.  Patient admits that her mood stays down and depressed much of the time and admits that she might of had suicidal thoughts at the time.  She says her mood is still depressed now but denies any suicidal intent or plan.  Denies any psychosis or hallucinations.  She is agreeable to outpatient treatment.  Her  insight and thinking about all of this is rather superficial.  Social history: Evidently has been homeless for a long time.  She says that she is hoping to go stay with her daughter in [place] when she leaves here.  This despite the fact that apparently she does get disability.  Medical history: Chronic COPD.  She had not been on home oxygen by her report before coming into the hospital.  She is probably going to be on oxygen when she is discharged.  Substance abuse history: Patient has a documented long history of alcohol abuse.  He has had alcohol withdrawal problems in the past.  Patient does not appear to have any familiarity with substance abuse treatment.  Past Psychiatric History: Patient had a prior admission last year at [facility] under almost identical circumstances.  Patient says she never goes for any outpatient treatment.  She seems to have little interest in this and not to really see why it might be important.  She says they did give her antidepressants but she does not remember much of anything about it and has no opinion as to whether it was helpful.  She does have a history of overdose usually when intoxicated and unclear how much of this was ever conscious intent.  Risk to Self:   Risk to Others:   Prior Inpatient Therapy:   Prior Outpatient Therapy:    Past Medical History:  Past Medical History:  Diagnosis Date  . COPD (chronic obstructive pulmonary disease) ([place])   . Emphysema of lung ([place])   . Polysubstance abuse [facility])     Past Surgical History:  Procedure Laterality Date  . NO PAST SURGERIES     Family History:  Family History  Problem Relation Age of Onset  . Epilepsy Mother   . Emphysema Father   . Alcohol abuse Father    Family Psychiatric  History: Denies any Social History:  Social History   Substance and Sexual Activity  Alcohol Use No     Social History   Substance and Sexual Activity  Drug Use Yes    Social History   Socioeconomic  History  . Marital status: Married    Spouse name: Not on file  . Number of children: Not on file  . Years of education: Not on file  . Highest education level: Not on file  Occupational History  . Not on file  Social Needs  . Financial resource strain: Not on file  . Food insecurity:    Worry: Not on file    Inability: Not on file  . Transportation needs:    Medical: Not on file    Non-medical: Not on file  Tobacco Use  . Smoking status: Current Every Day Smoker    Packs/day: 0.50    Types: Cigarettes  . Smokeless tobacco: Never Used  Substance and Sexual Activity  . Alcohol use: No  . Drug use: Yes  . Sexual activity: Not on file  Lifestyle  . Physical activity:    Days per week: Not on file    Minutes per session: Not on file  . Stress: Not on file  Relationships  . Social connections:    Talks on phone: Not on file    Gets together: Not on file    Attends religious service: Not on file    Active member of club or organization: Not on file    Attends meetings of clubs or organizations: Not on file    Relationship status: Not on file  Other Topics Concern  . Not on file  Social History Narrative  . Not on file   Additional Social History:    Allergies:  No Known Allergies  Labs:  Results for orders placed or performed during the hospital encounter of 01/09/18 (from the past 48 hour(s))  Prealbumin     Status: Abnormal   Collection Time: 01/14/18  3:35 PM  Result Value Ref Range   Prealbumin 8.7 (L) 18 - 38 mg/dL    Comment: Performed at [facility] [address] [place], [place] [address]  Ammonia     Status: Abnormal   Collection Time: 01/14/18  3:35 PM  Result Value Ref Range   Ammonia <9 (L) 9 - 35 umol/L    Comment: Performed at [facility], [address] [place], [place] [address]  RPR     Status: None   Collection Time: 01/14/18  3:35 PM  Result Value Ref Range   RPR Ser Ql Non Reactive Non Reactive    Comment:  (NOTE) Performed At: [facility] [place], [place] [id number] Rush Farmer MD [id number] Performed at [facility], [address] [place], [place] [address]   Glucose, capillary     Status: None   Collection Time: 01/14/18  4:24 PM  Result Value Ref Range   Glucose-Capillary 89 70 - 99 mg/dL   Comment 1 Notify RN    Comment 2 Document in Chart   Glucose, capillary     Status: Abnormal   Collection Time: 01/14/18  7:45 PM  Result Value Ref Range  Glucose-Capillary 116 (H) 70 - 99 mg/dL  Glucose, capillary     Status: Abnormal   Collection Time: 01/15/18  1:41 AM  Result Value Ref Range   Glucose-Capillary 125 (H) 70 - 99 mg/dL   Comment 1 Notify RN    Comment 2 Document in Chart   Glucose, capillary     Status: Abnormal   Collection Time: 01/15/18  3:47 AM  Result Value Ref Range   Glucose-Capillary 121 (H) 70 - 99 mg/dL   Comment 1 Notify RN    Comment 2 Document in Chart   Glucose, capillary     Status: Abnormal   Collection Time: 01/15/18  7:45 AM  Result Value Ref Range   Glucose-Capillary 120 (H) 70 - 99 mg/dL  Glucose, capillary     Status: Abnormal   Collection Time: 01/15/18 11:50 AM  Result Value Ref Range   Glucose-Capillary 113 (H) 70 - 99 mg/dL  Glucose, capillary     Status: Abnormal   Collection Time: 01/15/18  4:43 PM  Result Value Ref Range   Glucose-Capillary 133 (H) 70 - 99 mg/dL  Glucose, capillary     Status: Abnormal   Collection Time: 01/15/18  7:54 PM  Result Value Ref Range   Glucose-Capillary 111 (H) 70 - 99 mg/dL  Glucose, capillary     Status: Abnormal   Collection Time: 01/15/18 11:34 PM  Result Value Ref Range   Glucose-Capillary 139 (H) 70 - 99 mg/dL   Comment 1 Notify RN    Comment 2 Document in Chart   Glucose, capillary     Status: Abnormal   Collection Time: 01/16/18  3:26 AM  Result Value Ref Range   Glucose-Capillary 157 (H) 70 - 99 mg/dL   Comment 1 Notify RN    Comment 2 Document in  Chart   Basic metabolic panel     Status: Abnormal   Collection Time: 01/16/18  5:02 AM  Result Value Ref Range   Sodium 140 135 - 145 mmol/L   Potassium 3.6 3.5 - 5.1 mmol/L   Chloride 100 98 - 111 mmol/L    Comment: Please note change in reference range.   CO2 31 22 - 32 mmol/L   Glucose, Bld 150 (H) 70 - 99 mg/dL    Comment: Please note change in reference range.   BUN 23 (H) 6 - 20 mg/dL    Comment: Please note change in reference range.   Creatinine, Ser 0.49 0.44 - 1.00 mg/dL   Calcium 8.7 (L) 8.9 - 10.3 mg/dL   GFR calc non Af Amer >60 >60 mL/min   GFR calc Af Amer >60 >60 mL/min    Comment: (NOTE) The eGFR has been calculated using the CKD EPI equation. This calculation has not been validated in all clinical situations. eGFR's persistently <60 mL/min signify possible Chronic Kidney Disease.    Anion gap 9 5 - 15    Comment: Performed at [facility], [address] [place], [place] [address]  Magnesium     Status: None   Collection Time: 01/16/18  5:02 AM  Result Value Ref Range   Magnesium 1.9 1.7 - 2.4 mg/dL    Comment: Performed at [facility], [address] [place], [place] [address]  Phosphorus     Status: None   Collection Time: 01/16/18  5:02 AM  Result Value Ref Range   Phosphorus 3.7 2.5 - 4.6 mg/dL    Comment: Performed at [facility], [address] [place], [place] [address]  Glucose, capillary     Status: Abnormal   Collection Time: 01/16/18  7:30 AM  Result Value Ref Range   Glucose-Capillary 123 (H) 70 - 99 mg/dL  Glucose, capillary     Status: Abnormal   Collection Time: 01/16/18 12:11 PM  Result Value Ref Range   Glucose-Capillary 184 (H) 70 - 99 mg/dL    Current Facility-Administered Medications  Medication Dose Route Frequency Provider Last Rate Last Dose  . 0.9 %  sodium chloride infusion   Intravenous Continuous Tukov-Yual, Magdalene S, NP   Stopped at 01/13/18 0300  . acetaminophen (TYLENOL) tablet 650  mg  650 mg Oral Q6H PRN Lance Coon, MD   650 mg at 01/16/18 0919   Or  . acetaminophen (TYLENOL) suppository 650 mg  650 mg Rectal Q6H PRN Lance Coon, MD      . albuterol (PROVENTIL) (2.5 MG/3ML) 0.083% nebulizer solution 2.5-5 mg  2.5-5 mg Inhalation Q6H PRN Lance Coon, MD      . Ampicillin-Sulbactam (UNASYN) 3 g in sodium chloride 0.9 % 100 mL IVPB  3 g Intravenous Q6H Flora Lipps, MD   Stopped at 01/16/18 2263  . budesonide (PULMICORT) nebulizer solution 0.5 mg  0.5 mg Nebulization BID Salary, Montell D, MD   0.5 mg at 01/16/18 0821  . chlorhexidine gluconate (MEDLINE KIT) (PERIDEX) 0.12 % solution 15 mL  15 mL Mouth Rinse BID Awilda Bill, NP   15 mL at 01/15/18 2129  . cloNIDine (CATAPRES) tablet 0.1 mg  0.1 mg Oral BID Tukov-Yual, Magdalene S, NP   0.1 mg at 01/16/18 0919  . enoxaparin (LOVENOX) injection 30 mg  30 mg Subcutaneous Q24H Henreitta Leber, MD   30 mg at 01/16/18 0919  . famotidine (PEPCID) 40 MG/5ML suspension 20 mg  20 mg Per Tube BID Loney Hering D, MD   20 mg at 01/16/18 0920  . feeding supplement (ENSURE ENLIVE) (ENSURE ENLIVE) liquid 237 mL  237 mL Oral TID BM Henreitta Leber, MD   237 mL at 01/16/18 1220  . folic acid (FOLVITE) tablet 1 mg  1 mg Oral Daily Conforti, Darien Kading, DO   1 mg at 01/16/18 0919  . guaiFENesin (MUCINEX) 12 hr tablet 600 mg  600 mg Oral BID Loney Hering D, MD   600 mg at 01/16/18 0919  . insulin aspart (novoLOG) injection 0-9 Units  0-9 Units Subcutaneous Q4H Awilda Bill, NP   1 Units at 01/13/18 1231  . ipratropium-albuterol (DUONEB) 0.5-2.5 (3) MG/3ML nebulizer solution 3 mL  3 mL Nebulization BID Henreitta Leber, MD   3 mL at 01/16/18 3354  . megestrol (MEGACE) 400 MG/10ML suspension 400 mg  400 mg Oral BID Loney Hering D, MD   400 mg at 01/16/18 5625  . methylPREDNISolone sodium succinate (SOLU-MEDROL) 125 mg/2 mL injection 60 mg  60 mg Intravenous Q12H Henreitta Leber, MD   60 mg at 01/16/18 0918  . metoprolol tartrate  (LOPRESSOR) tablet 25 mg  25 mg Oral BID Loney Hering D, MD   25 mg at 01/16/18 0919  . mirtazapine (REMERON) tablet 30 mg  30 mg Oral QHS Chau Savell T, MD      . multivitamin with minerals tablet 1 tablet  1 tablet Oral Daily Henreitta Leber, MD   1 tablet at 01/16/18 [phone]  . nicotine (NICODERM CQ - dosed in mg/24 hours) patch 14 mg  14 mg Transdermal Daily Salary, Avel Peace, MD  14 mg at 01/16/18 0920  . ondansetron (ZOFRAN) tablet 4 mg  4 mg Oral Q6H PRN Lance Coon, MD       Or  . ondansetron [facility]) injection 4 mg  4 mg Intravenous Q6H PRN Lance Coon, MD      . polyvinyl alcohol (LIQUIFILM TEARS) 1.4 % ophthalmic solution 1 drop  1 drop Both Eyes PRN Henreitta Leber, MD   1 drop at 01/16/18 0920  . sodium chloride flush (NS) 0.9 % injection 10-40 mL  10-40 mL Intracatheter Q12H Salary, Montell D, MD   30 mL at 01/16/18 0926  . sodium chloride flush (NS) 0.9 % injection 10-40 mL  10-40 mL Intracatheter PRN Salary, Montell D, MD   10 mL at 01/13/18 0000  . thiamine (VITAMIN B-1) tablet 100 mg  100 mg Oral Daily Conforti, Crit Obremski, DO   100 mg at 01/16/18 9675   Or  . thiamine (B-1) injection 100 mg  100 mg Intravenous Daily Conforti, Milas Schappell, DO   100 mg at 01/13/18 1126    Musculoskeletal: Strength & Muscle Tone: within normal limits Gait & Station: normal Patient leans: N/A  Psychiatric Specialty Exam: Physical Exam  Nursing note and vitals reviewed. Constitutional: She appears well-developed and well-nourished.  HENT:  Head: Normocephalic and atraumatic.  Eyes: Pupils are equal, round, and reactive to light. Conjunctivae are normal.  Neck: Normal range of motion.  Cardiovascular: Normal heart sounds.  Respiratory: Effort normal.      GI: Soft.  Musculoskeletal: Normal range of motion.  Neurological: She is alert.  Skin: Skin is warm and dry.  Psychiatric: Her speech is normal. Her affect is blunt. She is not agitated and not aggressive. Thought content is not  paranoid. Cognition and memory are impaired. She expresses impulsivity. She exhibits a depressed mood. She expresses no homicidal and no suicidal ideation. She exhibits abnormal recent memory and abnormal remote memory.    Review of Systems  Constitutional: Negative.   HENT: Negative.   Eyes: Negative.   Respiratory: Negative.        Still on oxygen although she is not complaining of any respiratory symptoms at the moment  Cardiovascular: Negative.   Gastrointestinal: Negative.   Musculoskeletal: Negative.   Skin: Negative.   Neurological: Negative.   Psychiatric/Behavioral: Positive for depression, memory loss and substance abuse. Negative for hallucinations and suicidal ideas. The patient is not nervous/anxious.     Blood pressure (!) 134/93, pulse 64, temperature 97.7 F (36.5 C), temperature source Oral, resp. rate 18, height _0  (1.575 m), weight 41.2 kg (90 lb 13.3 oz), SpO2 92 %.Body mass index is 16.61 kg/m.  General Appearance: Casual  Eye Contact:  Fair  Speech:  Slow  Volume:  Decreased  Mood:  Euthymic  Affect:  Constricted  Thought Process:  Goal Directed  Orientation:  Full (Time, Place, and Person)  Thought Content:  Logical  Suicidal Thoughts:  No  Homicidal Thoughts:  No  Memory:  Immediate;   Fair Recent;   Fair Remote;   Poor  Judgement:  Impaired  Insight:  Shallow  Psychomotor Activity:  Decreased  Concentration:  Concentration: Poor  Recall:  Poor  Fund of Knowledge:  Poor  Language:  Poor  Akathisia:  No  Handed:  Right  AIMS (if indicated):     Assets:  Social Support  ADL's:  Impaired  Cognition:  Impaired,  Mild  Sleep:        Treatment Plan Summary: Daily contact with patient  to assess and evaluate symptoms and progress in treatment, Medication management and Plan 57 year old woman with alcohol and drug abuse and chronic depression.  Also in talking with her I clearly got the impression that she is to some degree intellectually impaired.   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  Her understanding of her situation is superficial at best.  Patient is stating she has no intention or plan of hurting herself.  No interest in any kind of specific treatment.  She believes that just deciding she will not drink and not kill herself is enough to fix her problem.  I see in the past that she was on mirtazapine at [facility] so I am going to restart that medicine although I do not know if she will take it.  She should be encouraged to follow-up with local mental health provider which if she [facility] to [facility].  At this point however she does not meet commitment criteria.  Discontinued IV C.  Case reviewed with hospitalist.  Disposition: No evidence of imminent risk to self or others at present.   Patient does not meet criteria for psychiatric inpatient admission. Supportive therapy provided about ongoing stressors. Discussed crisis plan, support from social network, calling 911, coming to the Emergency Department, and calling Suicide Hotline.  Alethia Berthold, MD 01/16/2018 1:15 PM

## 2018-01-20 ENCOUNTER — Telehealth: Payer: Self-pay

## 2018-01-20 NOTE — Telephone Encounter (Signed)
I was able to reach Erica Schmidt upon second attempt.  She mentioned having an issue with her mother staying with her due to lease requirements and is having to find another place for her to stay.  She expressed that her mother is being incorporative.  Daughter's current plan is to take her to her aunt's house in [place].  Due to address change,  I advised her to call [facility] to make them aware of the change, as well as check to see if this was still in their service area.  Relayed number of [phone] to [facility].  She said she would call.

## 2018-01-20 NOTE — Telephone Encounter (Signed)
Flagged on EMMI report for having wrong number.  Olegario MessierKathy Allmond spoke with them and updated number to reflect correct one in chart.  Daughter mentioned she had more questions to ask, one being about medication sent to the wrong place, however the daughter was not able to talk at time of call due to being on an "important call".  Olegario MessierKathy asked if I could follow up as she needed to leave early.  I attempted to reach [name] at [phone] around 2:30pm, however had to leave message encouraging her to call back for assistance with questions.  Informed her during message that I would be available until 3:30pm today.  Will attempt at later time.

## 2018-01-23 NOTE — Telephone Encounter (Signed)
Patient's daughter contacted me this morning with patient's new address - [address] [place], [place] [address].  She also wanted to know if I could contact the home health agency to make them aware.  I reminded her that I instructed her on Friday to call [facility] [place] to let them know of the change of address. Daughter said she forgot and would take care of it.  She also wanted the number for [facility] to let them know of her change of address. Relayed both the  store's number and the [phone] number to her.     Amber called back after 5 minutes to let me know that [facility] couldn't see her mother and wanted to know what she should do.  I informed her I would reach out to [facility] with discharge and see what we could do about setting her up with a different agency and that we would call her back.

## 2018-01-23 NOTE — Care Management (Addendum)
RNCM emailed [facility] Psychologist, occupational(Teresa/Jerry) regarding [facility] not being able to see patient for home health.  [facility] [phone] did accept patient and ideally, [facility] should seek another provider unless patient didn't meet criteria for home health.  They could not see until today per my last CM note.   RNCM has reached back to daughter Hospital doctorAmber.  Amber spoke with [facility] and she was told that they couldn't accept her- which is not what I was told. RNCM [facility] with [facility] - she said they didn't have staff. RNCM shared that patient was accepted last Thursday and told that [facility] would have a RN available Monday (today's date 01/23/18). RNCM then [facility]. She states that "they never accepted patient and that they are currently in nursing crisis so they could not have accepted patient".  RNCM reached back to [facility] daughter and was forwarded to voice mail- message left. RNCM also asked if patient was able to follow up with NP Orvan Falconerampbell on 01/20/18.  RNCM explained that if she would like, outpaient PT that could be coordinated if patient would agree and to call RNCM back.  There is no home health agency that can cover her location at this time. Update 1625: RNCM has not received a response from [facility] to follow up on communication with [facility] and order.

## 2018-01-24 NOTE — Care Management (Signed)
RNCM received notification back from Denaireresa with with [facility].  [facility] has arranged [facility] to see patient and Rosey Batheresa has updated patient's daughter.Daughter never called this RNCM back.

## 2018-01-25 NOTE — Care Management (Signed)
Update 01/25/18 0852AM: RNCM received callback from [place] with [facility]. [facility] has pulled out for home health. Patient and daughter are both aware per Rosey Batheresa. Patient is now in [place] living with her sister.  Resources provided per Rosey Batheresa for CAP services however no other home health agency would accept patient at this time.

## 2018-03-12 DEATH — deceased

## 2019-05-24 IMAGING — DX DG CHEST 1V PORT
1 series · 1 of 1 positions shown · non-contrast
Comparison: None.

CLINICAL DATA: Hypoxia with altered mental status

EXAM:
PORTABLE CHEST 1 VIEW

[chest ap]
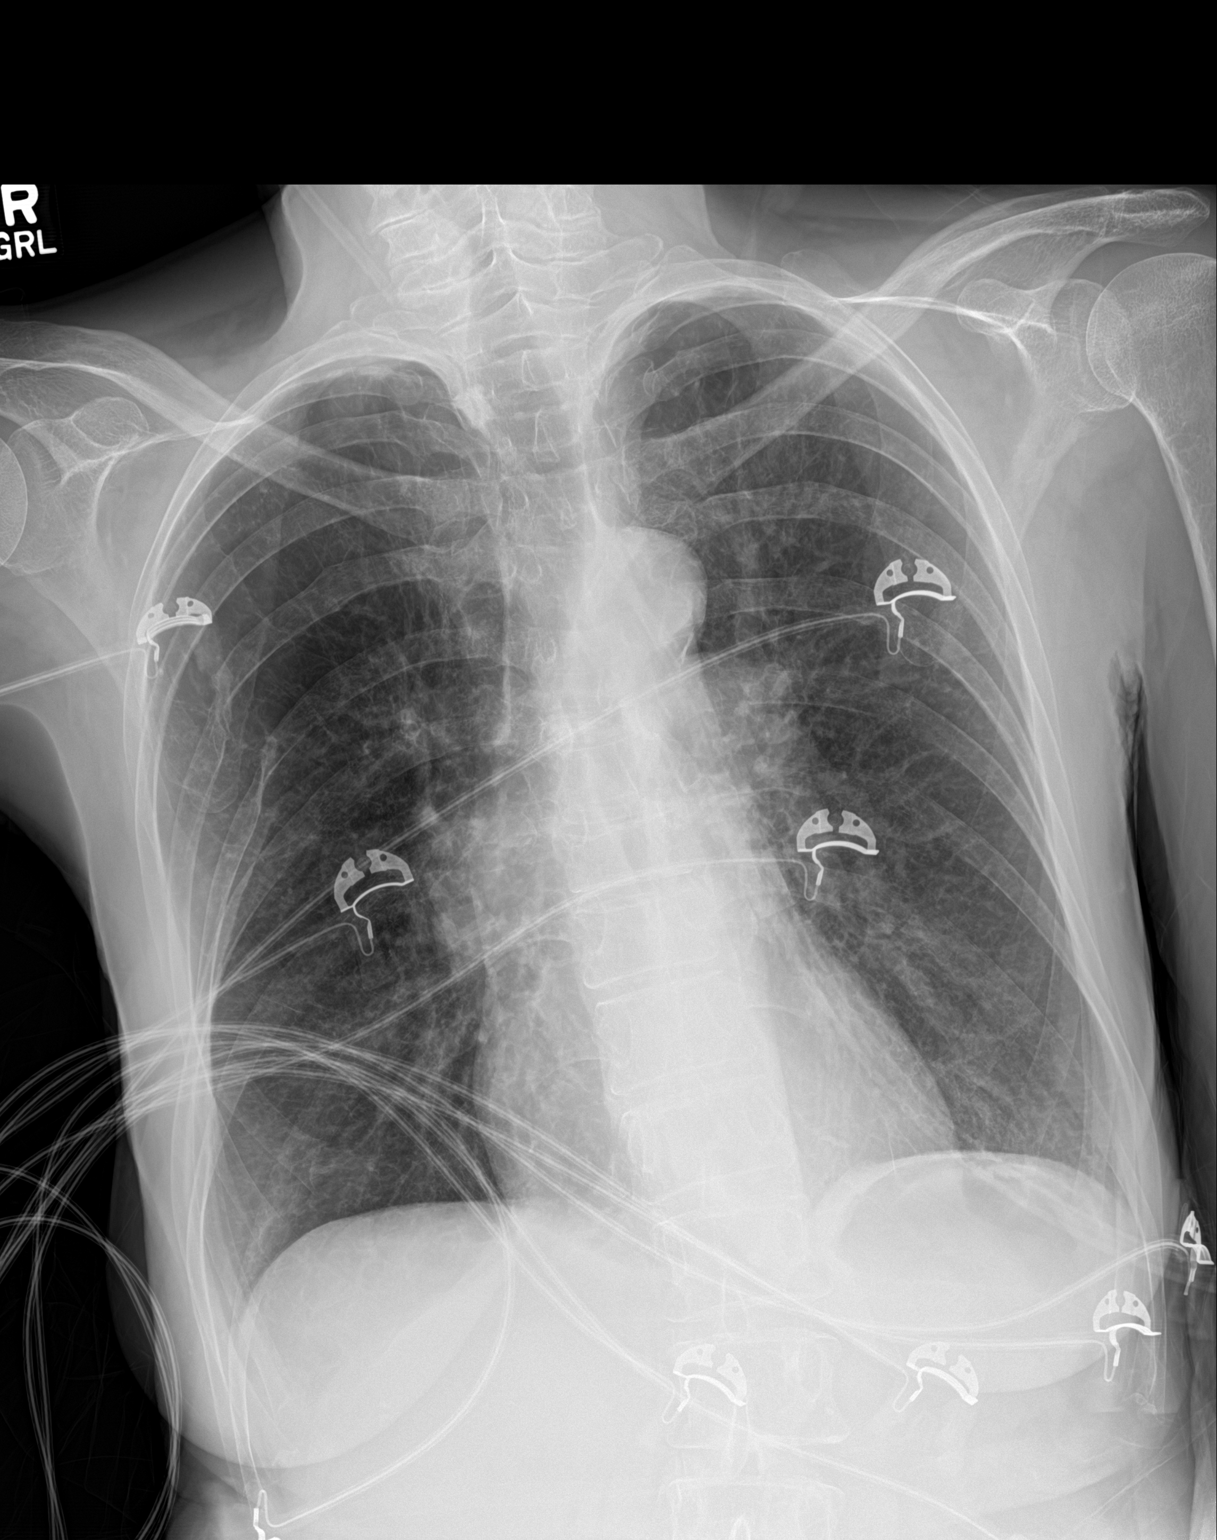

[1 of 1 positions shown; findings below may reference images not displayed]

FINDINGS: Hyperinflation with emphysematous disease. No acute consolidation or
effusion. Cardiomediastinal silhouette within normal limits. Aortic
atherosclerosis. No pneumothorax. Old appearing right sixth and
seventh rib deformities.
IMPRESSION: No active disease.  Hyperinflation with emphysematous disease.

## 2019-05-25 IMAGING — DX DG ABDOMEN 1V
1 series · 1 of 1 positions shown · non-contrast
Comparison: None.

CLINICAL DATA: Nasogastric tube placement

EXAM:
ABDOMEN - 1 VIEW

[abdomen supine]
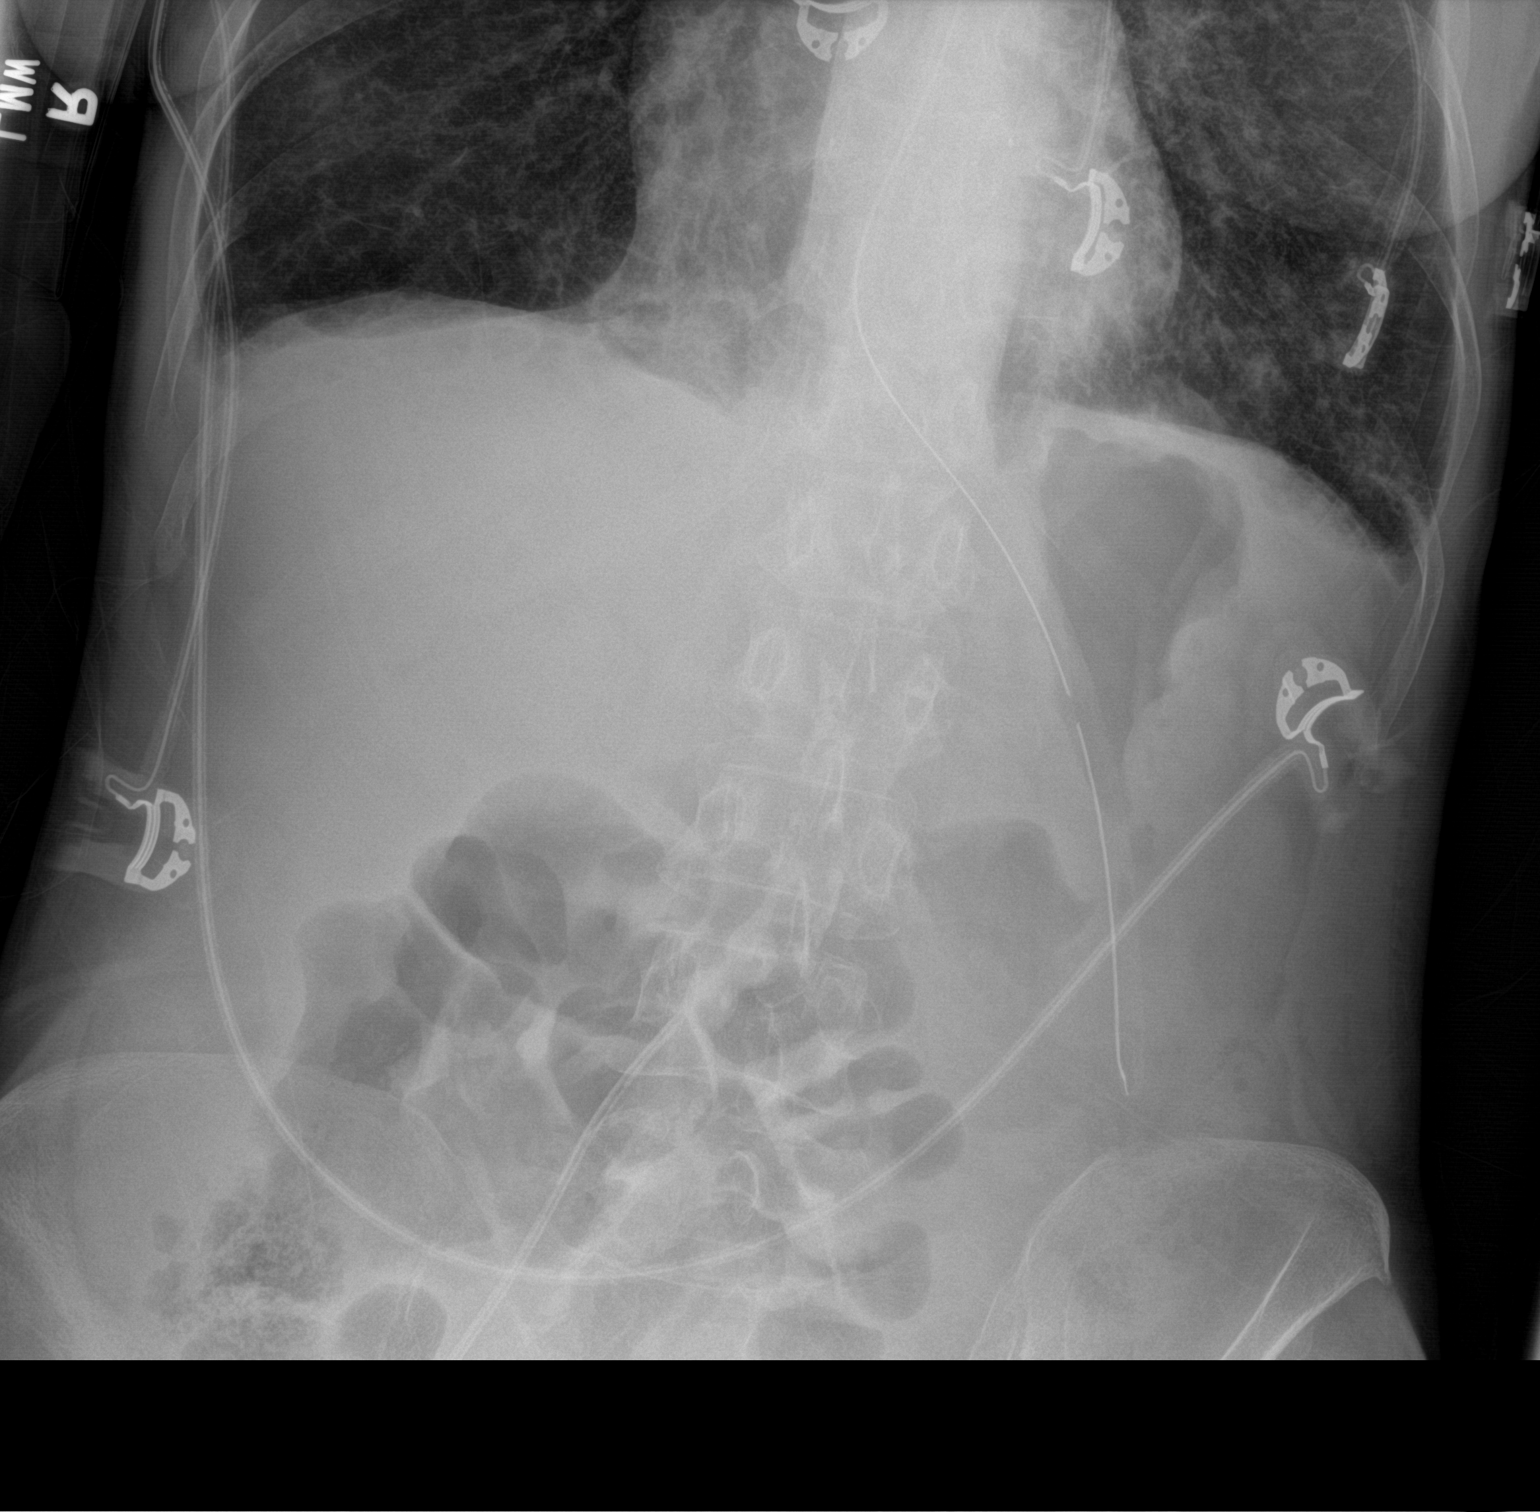

[1 of 1 positions shown; findings below may reference images not displayed]

FINDINGS: Nasogastric tube tip and side port are in the stomach. There is no
bowel dilatation or air-fluid level to suggest bowel obstruction. No
free air. Lung bases are clear. Lungs appear hyperexpanded.
IMPRESSION: Nasogastric tube tip and side port in stomach. No bowel obstruction
or free air evident.

## 2019-05-26 IMAGING — DX DG CHEST 1V PORT
1 series · 2 of 2 positions shown · non-contrast
Comparison: Yesterday

CLINICAL DATA: Intubation.  COPD

EXAM:
PORTABLE CHEST 1 VIEW

[Series 1: chest ap · 0.14mm/px · 2 of 2 slices shown]
[im 1/2]
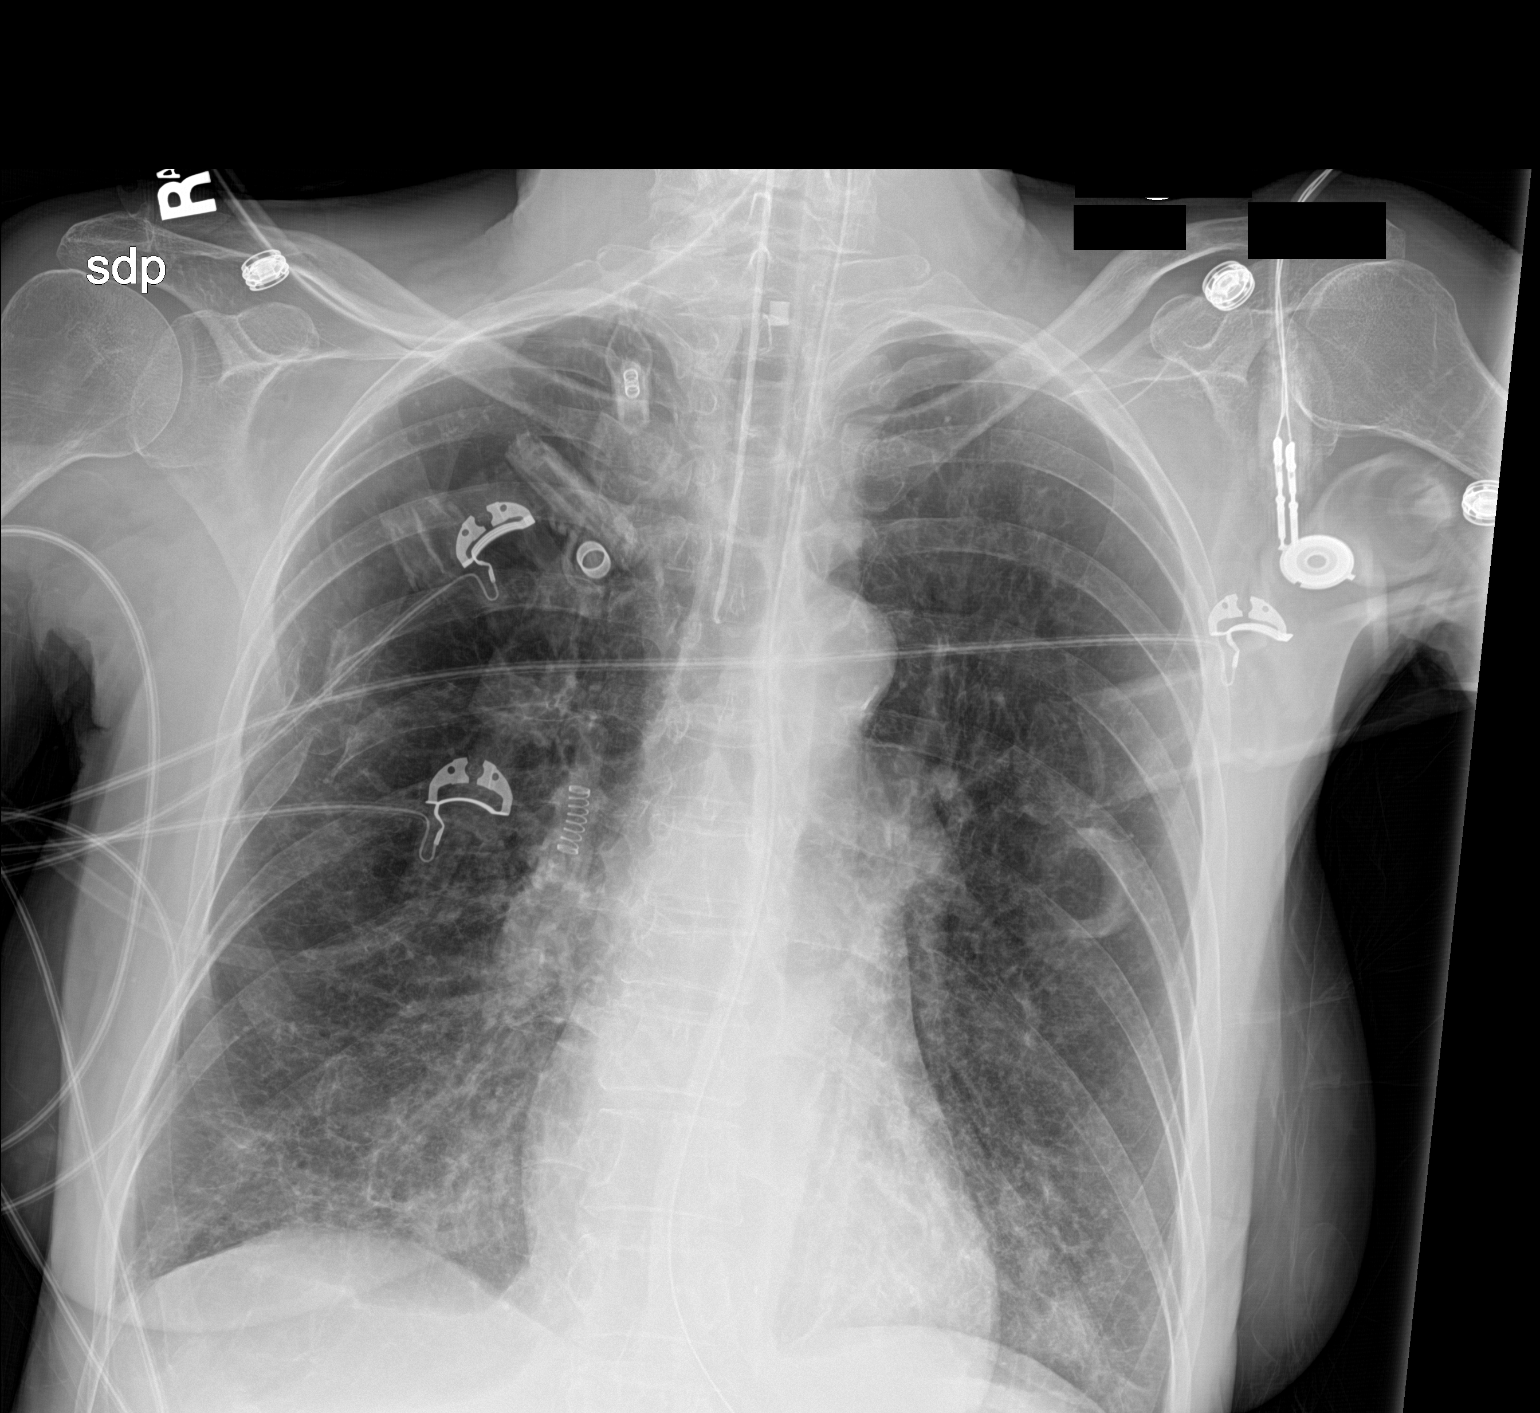
[im 2/2]
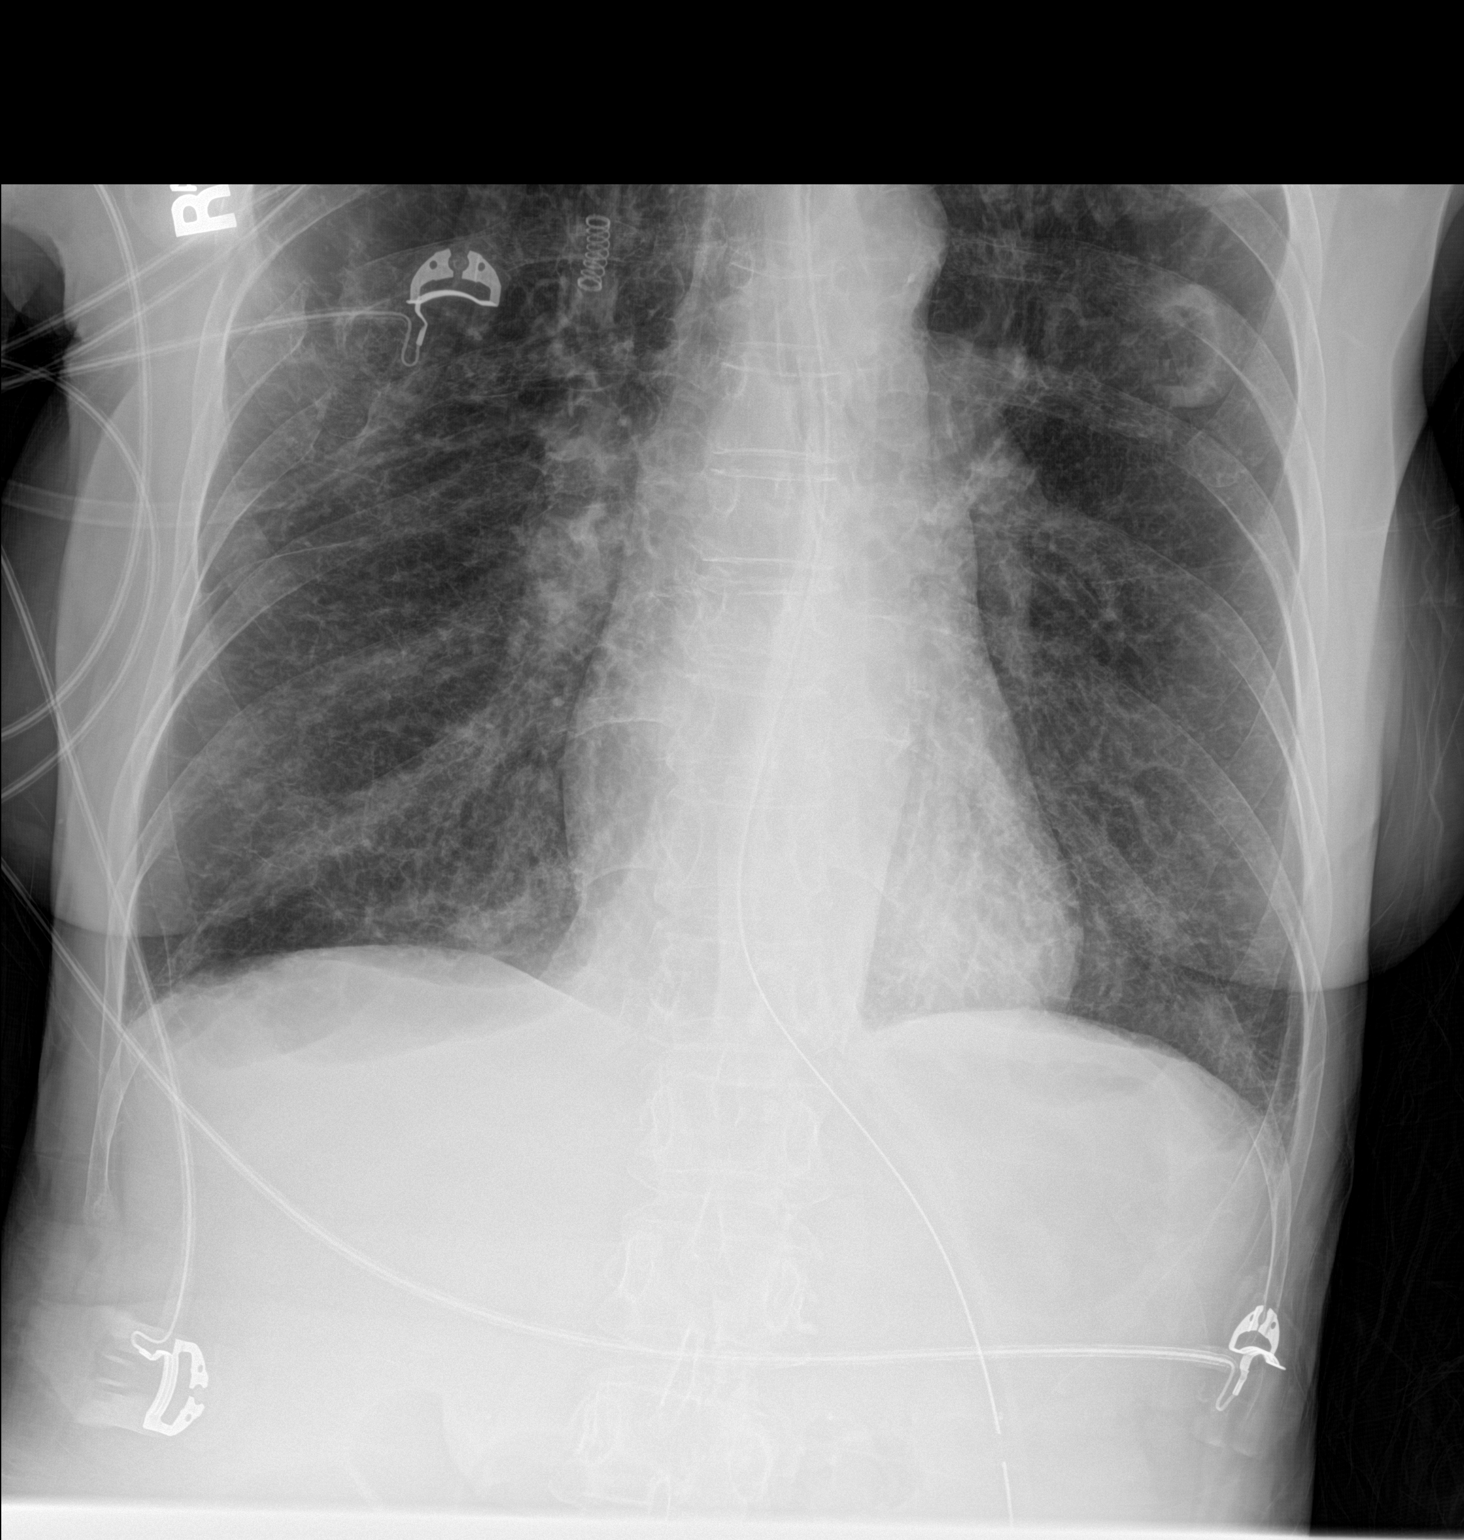

[2 of 2 positions shown; findings below may reference images not displayed]

FINDINGS: Endotracheal tube tip between the clavicular heads and carina. An
orogastric tube reaches the stomach. Hyperinflation and interstitial
coarsening. There is no consolidation, effusion, or pneumothorax.
Artifact from EKG leads and other hardware. Remote and nonunited
right sixth and seventh rib fractures.
IMPRESSION: 1. COPD.  Symmetric interstitial opacities is likely bronchitic.
2. Stable hardware positioning.

## 2019-05-27 IMAGING — DX DG CHEST 1V PORT
1 series · 1 of 1 positions shown · non-contrast
Comparison: 01/11/2018

CLINICAL DATA: Ventilator dependence.

EXAM:
PORTABLE CHEST 1 VIEW

[chest ap]
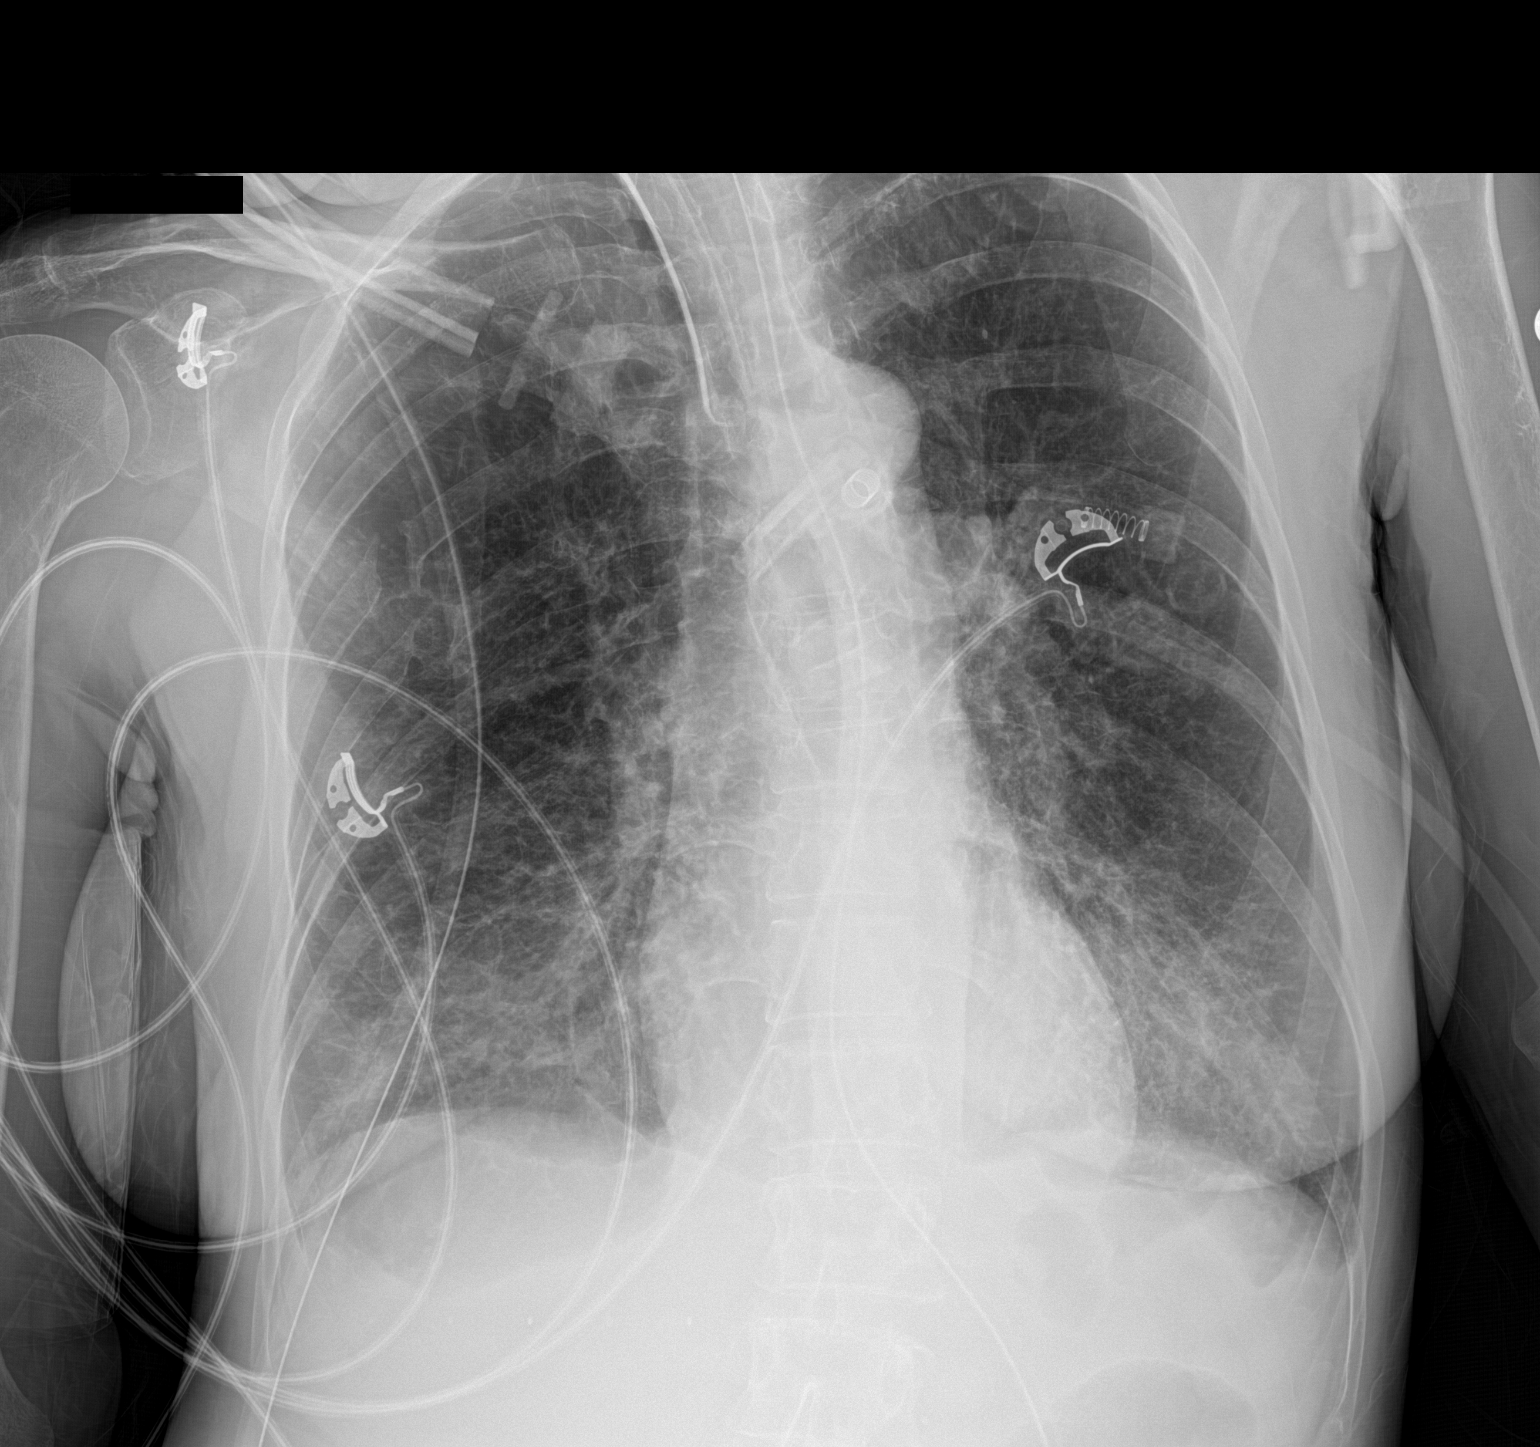

[1 of 1 positions shown; findings below may reference images not displayed]

FINDINGS: 7363 hours. Endotracheal tube tip is 2.5 cm above the base of the
carina. The NG tube passes into the stomach although the distal tip
position is not included on the film. Lungs are hyperexpanded.
Interstitial markings are diffusely coarsened with chronic features.
The cardiopericardial silhouette is within normal limits for size.
Bones are diffusely demineralized with nonacute posterior right rib
fractures.. Telemetry leads overlie the chest.
IMPRESSION: 1. Stable exam.
2. Emphysema with underlying chronic interstitial disease.
